# Patient Record
Sex: Female | Born: 1950 | Race: White | Hispanic: No | Smoking: Former smoker
Health system: Southern US, Community
[De-identification: ages and names within clinical notes are randomized; demographics above are authoritative.]

## PROBLEM LIST (undated history)

## (undated) DIAGNOSIS — G43909 Migraine, unspecified, not intractable, without status migrainosus: Secondary | ICD-10-CM

## (undated) DIAGNOSIS — L230 Allergic contact dermatitis due to metals: Secondary | ICD-10-CM

## (undated) DIAGNOSIS — Z780 Asymptomatic menopausal state: Secondary | ICD-10-CM

## (undated) DIAGNOSIS — E039 Hypothyroidism, unspecified: Secondary | ICD-10-CM

## (undated) DIAGNOSIS — M8580 Other specified disorders of bone density and structure, unspecified site: Secondary | ICD-10-CM

## (undated) DIAGNOSIS — E7800 Pure hypercholesterolemia, unspecified: Secondary | ICD-10-CM

## (undated) DIAGNOSIS — R87619 Unspecified abnormal cytological findings in specimens from cervix uteri: Secondary | ICD-10-CM

## (undated) DIAGNOSIS — E559 Vitamin D deficiency, unspecified: Secondary | ICD-10-CM

## (undated) DIAGNOSIS — F329 Major depressive disorder, single episode, unspecified: Secondary | ICD-10-CM

## (undated) DIAGNOSIS — R7301 Impaired fasting glucose: Secondary | ICD-10-CM

## (undated) HISTORY — DX: Asymptomatic menopausal state: Z78.0

## (undated) HISTORY — DX: Unspecified abnormal cytological findings in specimens from cervix uteri: R87.619

## (undated) HISTORY — DX: Major depressive disorder, single episode, unspecified: F32.9

## (undated) HISTORY — DX: Pure hypercholesterolemia, unspecified: E78.00

## (undated) HISTORY — PX: TUBAL LIGATION: SHX77

## (undated) HISTORY — PX: OTHER SURGICAL HISTORY: SHX169

## (undated) HISTORY — DX: Migraine, unspecified, not intractable, without status migrainosus: G43.909

## (undated) HISTORY — DX: Vitamin D deficiency, unspecified: E55.9

## (undated) HISTORY — DX: Allergic contact dermatitis due to metals: L23.0

## (undated) HISTORY — DX: Impaired fasting glucose: R73.01

## (undated) HISTORY — DX: Other specified disorders of bone density and structure, unspecified site: M85.80

## (undated) HISTORY — DX: Hypothyroidism, unspecified: E03.9

---

## 1999-03-29 ENCOUNTER — Other Ambulatory Visit: Admission: RE | Admit: 1999-03-29 | Discharge: 1999-03-29 | Payer: Self-pay | Admitting: Family Medicine

## 1999-04-19 DIAGNOSIS — M8580 Other specified disorders of bone density and structure, unspecified site: Secondary | ICD-10-CM

## 1999-04-19 HISTORY — DX: Other specified disorders of bone density and structure, unspecified site: M85.80

## 2000-04-18 DIAGNOSIS — E039 Hypothyroidism, unspecified: Secondary | ICD-10-CM

## 2000-04-18 HISTORY — DX: Hypothyroidism, unspecified: E03.9

## 2000-12-05 ENCOUNTER — Other Ambulatory Visit: Admission: RE | Admit: 2000-12-05 | Discharge: 2000-12-05 | Payer: Self-pay | Admitting: Family Medicine

## 2001-12-06 ENCOUNTER — Other Ambulatory Visit: Admission: RE | Admit: 2001-12-06 | Discharge: 2001-12-06 | Payer: Self-pay | Admitting: Family Medicine

## 2002-04-18 DIAGNOSIS — F32A Depression, unspecified: Secondary | ICD-10-CM

## 2002-04-18 HISTORY — DX: Depression, unspecified: F32.A

## 2002-12-09 ENCOUNTER — Other Ambulatory Visit: Admission: RE | Admit: 2002-12-09 | Discharge: 2002-12-09 | Payer: Self-pay | Admitting: Family Medicine

## 2003-01-06 ENCOUNTER — Ambulatory Visit (HOSPITAL_COMMUNITY): Admission: RE | Admit: 2003-01-06 | Discharge: 2003-01-06 | Payer: Self-pay | Admitting: Gastroenterology

## 2003-03-19 ENCOUNTER — Ambulatory Visit (HOSPITAL_COMMUNITY): Admission: RE | Admit: 2003-03-19 | Discharge: 2003-03-19 | Payer: Self-pay | Admitting: Gastroenterology

## 2003-03-19 LAB — HM COLONOSCOPY

## 2003-12-29 ENCOUNTER — Other Ambulatory Visit: Admission: RE | Admit: 2003-12-29 | Discharge: 2003-12-29 | Payer: Self-pay | Admitting: Family Medicine

## 2004-04-18 DIAGNOSIS — R7301 Impaired fasting glucose: Secondary | ICD-10-CM

## 2004-04-18 HISTORY — DX: Impaired fasting glucose: R73.01

## 2005-01-11 ENCOUNTER — Other Ambulatory Visit: Admission: RE | Admit: 2005-01-11 | Discharge: 2005-01-11 | Payer: Self-pay | Admitting: Family Medicine

## 2005-03-15 ENCOUNTER — Encounter: Admission: RE | Admit: 2005-03-15 | Discharge: 2005-04-17 | Payer: Self-pay | Admitting: Family Medicine

## 2007-01-18 LAB — HM PAP SMEAR

## 2009-05-05 LAB — HM MAMMOGRAPHY

## 2011-04-04 ENCOUNTER — Other Ambulatory Visit (HOSPITAL_COMMUNITY)
Admission: RE | Admit: 2011-04-04 | Discharge: 2011-04-04 | Disposition: A | Discharge status: Home / Self Care | Payer: 59 | Source: Ambulatory Visit | Attending: Family Medicine | Admitting: Family Medicine

## 2011-04-04 ENCOUNTER — Ambulatory Visit (INDEPENDENT_AMBULATORY_CARE_PROVIDER_SITE_OTHER): Payer: Self-pay | Admitting: Family Medicine

## 2011-04-04 ENCOUNTER — Encounter: Payer: Self-pay | Admitting: Family Medicine

## 2011-04-04 VITALS — BP 130/80 | HR 64 | Ht 65.0 in | Wt 136.0 lb

## 2011-04-04 DIAGNOSIS — M8580 Other specified disorders of bone density and structure, unspecified site: Secondary | ICD-10-CM

## 2011-04-04 DIAGNOSIS — G43909 Migraine, unspecified, not intractable, without status migrainosus: Secondary | ICD-10-CM | POA: Insufficient documentation

## 2011-04-04 DIAGNOSIS — R7301 Impaired fasting glucose: Secondary | ICD-10-CM

## 2011-04-04 DIAGNOSIS — E039 Hypothyroidism, unspecified: Secondary | ICD-10-CM

## 2011-04-04 DIAGNOSIS — Z0000 Encounter for general adult medical examination without abnormal findings: Secondary | ICD-10-CM

## 2011-04-04 DIAGNOSIS — Z01419 Encounter for gynecological examination (general) (routine) without abnormal findings: Secondary | ICD-10-CM | POA: Insufficient documentation

## 2011-04-04 DIAGNOSIS — M949 Disorder of cartilage, unspecified: Secondary | ICD-10-CM

## 2011-04-04 DIAGNOSIS — E559 Vitamin D deficiency, unspecified: Secondary | ICD-10-CM

## 2011-04-04 DIAGNOSIS — E7800 Pure hypercholesterolemia, unspecified: Secondary | ICD-10-CM

## 2011-04-04 LAB — TSH: TSH: 0.975 u[IU]/mL (ref 0.350–4.500)

## 2011-04-04 LAB — COMPREHENSIVE METABOLIC PANEL
ALT: 13 U/L (ref 0–35)
CO2: 27 mEq/L (ref 19–32)
Calcium: 9.1 mg/dL (ref 8.4–10.5)
Chloride: 104 mEq/L (ref 96–112)
Creat: 0.62 mg/dL (ref 0.50–1.10)
Glucose, Bld: 82 mg/dL (ref 70–99)
Total Bilirubin: 0.3 mg/dL (ref 0.3–1.2)
Total Protein: 6.6 g/dL (ref 6.0–8.3)

## 2011-04-04 LAB — POCT URINALYSIS DIPSTICK
Blood, UA: NEGATIVE
Glucose, UA: NEGATIVE
Protein, UA: NEGATIVE
Spec Grav, UA: <=1.005
Urobilinogen, UA: NEGATIVE

## 2011-04-04 LAB — LIPID PANEL
HDL: 68 mg/dL (ref >39)
LDL Cholesterol: 109 mg/dL — ABNORMAL HIGH (ref 0–99)
Triglycerides: 69 mg/dL (ref <150)

## 2011-04-04 MED ORDER — RIZATRIPTAN BENZOATE 10 MG PO TBDP: 10.0000 mg | ORAL_TABLET | ORAL | Status: DC | PRN

## 2011-04-04 NOTE — Patient Instructions (Addendum)
HEALTH MAINTENANCE RECOMMENDATIONS:  It is recommended that you get at least 30 minutes of aerobic exercise at least 5 days/week (for weight loss, you may need as much as 60-90 minutes). This can be any activity that gets your heart rate up. This can be divided in 10-15 minute intervals if needed, but try and build up your endurance at least once a week.  Weight bearing exercise is also recommended twice weekly.  Eat a healthy diet with lots of vegetables, fruits and fiber.  "Colorful" foods have a lot of vitamins (ie green vegetables, tomatoes, red peppers, etc).  Limit sweet tea, regular sodas and alcoholic beverages, all of which has a lot of calories and sugar.  Up to 1 alcoholic drink daily may be beneficial for women (unless trying to lose weight, watch sugars).  Drink a lot of water.  Calcium recommendations are 1200-1500 mg daily (1500 mg for postmenopausal women or women without ovaries), and vitamin D 1000 IU daily.  This should be obtained from diet and/or supplements (vitamins), and calcium should not be taken all at once, but in divided doses.  Monthly self breast exams and yearly mammograms for women over the age of 39 is recommended.  Sunscreen of at least SPF 30 should be used on all sun-exposed parts of the skin when outside between the hours of 10 am and 4 pm (not just when at beach or pool, but even with exercise, golf, tennis, and yard work!)  Use a sunscreen that says "broad spectrum" so it covers both UVA and UVB rays, and make sure to reapply every 1-2 hours.  Remember to change the batteries in your smoke detectors when changing your clock times in the spring and fall.  Use your seat belt every time you are in a car, and please drive safely and not be distracted with cell phones and texting while driving.   Please schedule mammogram (yearly). Your bone density is due again in January 2013 Please schedule routine eye exam. Schedule nurse visit for TdaP (tetanus with  pertussis) and Zostavax (shingles vaccine).  Please call your insurance company to verify coverage of vaccines

## 2011-04-04 NOTE — Progress Notes (Signed)
Anna Scott is a 60 y.o. female who presents for a complete physical.  She has the following concerns: None. She is here for physical, to establish care, and needs refills on her thyroid and migraine medications.  Gets migraines once or twice a month, and meds work well.  Health Maintenance: Immunization History  Administered Date(s) Administered  . Pneumococcal Conjugate 01/18/2007  . Td 12/06/2001  Had flu shot in September of 2012 Last Pap smear: 12/09 Last mammogram: 04/2009 Last colonoscopy: 2004 Dr. Loreta Ave Last DEXA: 2007 Garald Braver), 04/2009 per last OV in old records Dentist: every 6 months Ophtho: many years ago (7-8 years) Exercise: walks 30 minutes 5x/week  Past Medical History  Diagnosis Date  . Migraine headache   . Unspecified hypothyroidism 2002  . Abnormal Pap smear of cervix early 30's    s/p laser treatment  . Postmenopausal   . Depression 2004    stress at work  . Impaired fasting glucose 2006  . Pure hypercholesterolemia     statin intolerance  . 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 Osteopenia 2001  . Unspecified vitamin D deficiency     Past Surgical History  Procedure Date  . Tubal ligation   . Laser treatment for abnormal pap 30's    History   Social History  . Marital Status: Widowed    Spouse Name: N/A    Number of Children: 1  . 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 Smoking status: Former Smoker    Quit date: 04/18/2000  . Smokeless tobacco: Never Used  . Alcohol Use: Yes     few times a year  . Drug Use: No  . Sexually Active: Not Currently   Other Topics Concern  . Not on file   Social History Narrative   Lives alone.  Daughter lives in [place]    Family History  Problem Relation Age of Onset  . Cancer Mother     lung cancer  . Diabetes Sister   . Hyperlipidemia Sister   . Hypertension Sister   . Hypothyroidism Sister   . Heart disease  Maternal Grandfather     70's MI   Current outpatient prescriptions:calcium carbonate (OS-CAL) 600 MG TABS, Take 600 mg by mouth daily.  , Disp: , Rfl: ;  fish oil-omega-3 fatty acids 1000 MG capsule, Take 2 g by mouth daily.  , Disp: , Rfl: ;  levothyroxine (LEVOXYL) 125 MCG tablet, Take 125 mcg by mouth daily.  , Disp: , Rfl: ;  Multiple Vitamin (MULTIVITAMIN) tablet, Take 1 tablet by mouth daily.  , Disp: , Rfl:  rizatriptan (MAXALT-MLT) 10 MG disintegrating tablet, Take 1 tablet (10 mg total) by mouth as needed. May repeat in 2 hours if needed, Disp: 30 tablet, Rfl: 0;  vitamin D, CHOLECALCIFEROL, 400 UNITS tablet, Take 800 Units by mouth daily.  , Disp: , Rfl:   Allergies  Allergen Reactions  . Codeine Other (See Comments)    Nausea and felt like she was in another world.   ROS:  The patient denies anorexia, fever, weight changes, vision changes, decreased hearing, ear pain, sore throat, breast concerns, chest pain, palpitations, dizziness, syncope, dyspnea on exertion, cough, swelling, nausea, vomiting, diarrhea, constipation, abdominal pain, indigestion/heartburn, hematuria, incontinence, dysuria, vaginal bleeding, discharge, odor or itch, genital lesions, joint pains, numbness, tingling, weakness, tremor, suspicious skin lesions, depression, anxiety, abnormal bleeding/bruising, or enlarged lymph nodes.  +recent  URI with residual cough.  Occasional peri-anal itching and soreness/bump.  Rare BRB on toilet paper after a hard stool  PHYSICAL EXAM: BP 130/80  Pulse 64  Ht 5\' 5"  (1.651 m)  Wt 136 lb (61.689 kg)  BMI 22.63 kg/m2  General Appearance:    Alert, cooperative, no distress, appears stated age  Head:    Normocephalic, without obvious abnormality, atraumatic  Eyes:    PERRL, conjunctiva/corneas clear, EOM's intact, fundi    benign  Ears:    Normal TM's and external ear canals  Nose:   Nares normal, mucosa normal, no drainage or sinus   tenderness  Throat:   Lips, mucosa, and  tongue normal; teeth and gums normal  Neck:   Supple, no lymphadenopathy;  thyroid:  no   enlargement/tenderness/nodules; no carotid   bruit or JVD  Back:    Spine nontender, no curvature, ROM normal, no CVA     tenderness  Lungs:     Clear to auscultation bilaterally without wheezes, rales or     ronchi; respirations unlabored  Chest Wall:    No tenderness or deformity   Heart:    Regular rate and rhythm, S1 and S2 normal, no murmur, rub   or gallop  Breast Exam:    No tenderness, masses, or nipple discharge or inversion.      No axillary lymphadenopathy  Abdomen:     Soft, non-tender, nondistended, normoactive bowel sounds,    no masses, no hepatosplenomegaly  Genitalia:    Normal external genitalia without lesions.  BUS and vagina normal; cervix without lesions, or cervical motion tenderness. No abnormal vaginal discharge.  Uterus and adnexa not enlarged, nontender, no masses.  Pap performed  Rectal:    Normal tone, no masses or tenderness; guaiac negative stool  Extremities:   No clubbing, cyanosis or edema  Pulses:   2+ and symmetric all extremities  Skin:   Skin color, texture, turgor normal, no rashes or lesions  Lymph nodes:   Cervical, supraclavicular, and axillary nodes normal  Neurologic:   CNII-XII intact, normal strength, sensation and gait; reflexes 2+ and symmetric throughout          Psych:   Normal mood, affect, hygiene and grooming.    ASSESSMENT/PLAN: 1. Routine general medical examination at a health care facility  POCT Urinalysis Dipstick, Visual acuity screening, Cytology - PAP  2. Pure hypercholesterolemia  Lipid panel, Comprehensive metabolic panel  3. Impaired fasting glucose  Comprehensive metabolic panel, Hemoglobin A1c  4. Unspecified vitamin D deficiency  Vitamin D 25 hydroxy  5. Unspecified hypothyroidism  TSH  6. Migraine  rizatriptan (MAXALT-MLT) 10 MG disintegrating tablet   Needs Rx for Levoxyl to [facility]--await lab results first  Discussed monthly self  breast exams and yearly mammograms; at least 30 minutes of aerobic activity at least 5 days/week; proper sunscreen use reviewed; healthy diet, including goals of calcium and vitamin D intake and alcohol recommendations (less than or equal to 1 drink/day) reviewed; regular seatbelt use; changing batteries in smoke detectors.  Immunization recommendations discussed--TdaP and Zostavax were recommended.  She will schedule nurse visit within a month or so.  To check insurance prior to visit.  Colonoscopy recommendations reviewed--UTD  Discussed need to schedule mammogram and have it done yearly, as well as need to schedule routine eye exam Also patient to schedule bone density (due 04/2011) at [facility].

## 2011-04-05 ENCOUNTER — Encounter: Payer: Self-pay | Admitting: Family Medicine

## 2011-04-05 LAB — HEMOGLOBIN A1C: Mean Plasma Glucose: 128 mg/dL — ABNORMAL HIGH (ref <117)

## 2011-04-05 LAB — VITAMIN D 25 HYDROXY (VIT D DEFICIENCY, FRACTURES): Vit D, 25-Hydroxy: 35 ng/mL (ref 30–89)

## 2011-04-05 MED ORDER — LEVOXYL 125 MCG PO TABS: 125.0000 ug | ORAL_TABLET | Freq: Every day | ORAL | Status: DC

## 2011-04-06 ENCOUNTER — Encounter: Payer: Self-pay | Admitting: Family Medicine

## 2011-05-10 LAB — HM DEXA SCAN

## 2011-07-28 ENCOUNTER — Encounter: Payer: Self-pay | Admitting: Family Medicine

## 2011-07-28 ENCOUNTER — Ambulatory Visit (INDEPENDENT_AMBULATORY_CARE_PROVIDER_SITE_OTHER): Payer: 59 | Admitting: Family Medicine

## 2011-07-28 DIAGNOSIS — H9209 Otalgia, unspecified ear: Secondary | ICD-10-CM

## 2011-07-28 NOTE — Patient Instructions (Signed)
Ear discomfort/weird sensation--no evidence of cerumen impaction (wax buildup).  With the lack of wax present, may have some symptoms related to dryness or lack of lubrication.  Continue with oil drops.  Consider trying claritin or zyrtec which may help with some of the itching sensation, as there seems to be a mild component of allergies.  Return if symptoms persist or worsen.  Do not use Q-tips

## 2011-07-28 NOTE — Progress Notes (Signed)
Chief complaint:  crawling sensation in both ears, right worse than left.   HPI:  Has been having intermittent symptoms in both ears for the last 2-3 weeks of "crawling sensation".  Ears are sometimes itchy, but not much.  Feels like something is crawling in her ears.  Had a cold about 3 weeks ago, and symptoms began just after the cold/around same time. Has been using some mineral oil/olive oil drops.  Hasn't helped much.  Has slight sniffles, nothing worse than normal.  Denies plugging and popping in her ears.  Denies decreased hearing.  Ringing in ears x years, chronic and unchanged  Past Medical History  Diagnosis Date  . Migraine headache   . Unspecified hypothyroidism 2002  . Abnormal Pap smear of cervix early 30's    s/p laser treatment  . Postmenopausal   . Depression 2004    stress at work  . Impaired fasting glucose 2006  . Pure hypercholesterolemia     statin intolerance  . 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 Osteopenia 2001  . Unspecified vitamin D deficiency     Past Surgical History  Procedure Date  . Tubal ligation   . Laser treatment for abnormal pap 30's    History   Social History  . Marital Status: Widowed    Spouse Name: N/A    Number of Children: 1  . 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 Smoking status: Former Smoker    Quit date: 04/18/2000  . Smokeless tobacco: Never Used  . Alcohol Use: Yes     few times a year  . Drug Use: No  . Sexually Active: Not Currently   Other Topics Concern  . Not on file   Social History Narrative   Lives alone.  Daughter lives in [place]    Family History  Problem Relation Age of Onset  . Cancer Mother     lung cancer  . Diabetes Sister   . Hyperlipidemia Sister   . Hypertension Sister   . Hypothyroidism Sister   . Heart disease Maternal Grandfather     70's MI    Current outpatient prescriptions:calcium  carbonate (OS-CAL) 600 MG TABS, Take 600 mg by mouth daily.  , Disp: , Rfl: ;  fish oil-omega-3 fatty acids 1000 MG capsule, Take 2 g by mouth daily.  , Disp: , Rfl: ;  LEVOXYL 125 MCG tablet, Take 1 tablet (125 mcg total) by mouth daily., Disp: 90 tablet, Rfl: 3;  Multiple Vitamin (MULTIVITAMIN) tablet, Take 1 tablet by mouth daily.  , Disp: , Rfl:  rizatriptan (MAXALT-MLT) 10 MG disintegrating tablet, Take 1 tablet (10 mg total) by mouth as needed. May repeat in 2 hours if needed, Disp: 30 tablet, Rfl: 0;  vitamin D, CHOLECALCIFEROL, 400 UNITS tablet, Take 800 Units by mouth daily.  , Disp: , Rfl:   Allergies  Allergen Reactions  . Codeine Other (See Comments)    Nausea and felt like she was in another world.   ROS:  Denies fevers, ear pain, sinus pain, sore throat, cough, shortness of breath, skin rash, thyroid symptoms, headaches or other problems.  PHYSICAL EXAM: BP 132/80  Pulse 68  Temp(Src) 98.3 F (36.8 C) (Oral)  Ht 5\' 5"  (1.651 m)  Wt 135 lb (61.236 kg)  BMI 22.47 kg/m2 Well developed, pleasant female in no distress HEENT:  PERRL, EOMI, conjunctiva clear.  Ears completely normal--no  wax.  Slight bit of probable cotton in R posterior distal portion of canal.  No erythema, exudate. TM's normal. EAC's without any erythema, edema, exudate, very clean/slightly dry. Nose--mild edema with clear mucus L>R Sinuses nontender OP clear Neck: no lymphadenopathy  ASSESSMENT/PLAN: 1. Otalgia    Ear discomfort/weird sensation--no evidence of cerumen impaction.  With the lack of cerumen present, may have some symptoms related to dryness or lack of lubrication.  Continue with oil drops.  Consider trying claritin or zyrtec which may help with some of the itching sensation, as there seems to be a mild component of allergies.  Return if symptoms persist or worsen.  Do not use Q-tips

## 2011-08-12 ENCOUNTER — Other Ambulatory Visit: Payer: Self-pay | Admitting: Family Medicine

## 2011-08-12 MED ORDER — LEVOTHYROXINE SODIUM 125 MCG PO TABS: 125.0000 ug | ORAL_TABLET | Freq: Every day | ORAL | Status: DC

## 2011-08-12 NOTE — Telephone Encounter (Signed)
There was a [place] recall on Levoxyl.  Anyone who calls for Levoxyl should be changed to the same dose of Synthroid (brand). Thanks

## 2011-08-12 NOTE — Telephone Encounter (Signed)
PLS SIGN OFF ON MEDICATION, THANKS

## 2011-08-12 NOTE — Telephone Encounter (Signed)
Dr.knapp do you want to switch to levothyroxine or Synthroid which are both in stock since the Levoxyl is out of stock/nationwide shortage-Please advise, pt is almost out of pills-lm

## 2011-12-13 ENCOUNTER — Telehealth: Payer: Self-pay | Admitting: Internal Medicine

## 2011-12-13 NOTE — Telephone Encounter (Signed)
Pt would like generic synthroid sent to [facility].

## 2011-12-14 ENCOUNTER — Other Ambulatory Visit: Payer: Self-pay | Admitting: *Deleted

## 2011-12-14 DIAGNOSIS — E039 Hypothyroidism, unspecified: Secondary | ICD-10-CM

## 2011-12-14 MED ORDER — LEVOTHYROXINE SODIUM 125 MCG PO TABS: 125.0000 ug | ORAL_TABLET | Freq: Every day | ORAL | Status: DC

## 2011-12-14 NOTE — Telephone Encounter (Signed)
Done

## 2012-03-07 ENCOUNTER — Telehealth: Payer: Self-pay | Admitting: Internal Medicine

## 2012-03-07 DIAGNOSIS — E039 Hypothyroidism, unspecified: Secondary | ICD-10-CM

## 2012-03-07 MED ORDER — LEVOTHYROXINE SODIUM 125 MCG PO TABS: 125.0000 ug | ORAL_TABLET | Freq: Every day | ORAL | Status: DC

## 2012-03-07 NOTE — Telephone Encounter (Signed)
Done

## 2012-05-22 ENCOUNTER — Encounter: Payer: Self-pay | Admitting: Internal Medicine

## 2012-06-04 ENCOUNTER — Encounter: Payer: Self-pay | Admitting: Family Medicine

## 2012-06-04 ENCOUNTER — Ambulatory Visit (INDEPENDENT_AMBULATORY_CARE_PROVIDER_SITE_OTHER): Payer: 59 | Admitting: Family Medicine

## 2012-06-04 VITALS — BP 130/80 | HR 68 | Ht 65.0 in | Wt 140.0 lb

## 2012-06-04 DIAGNOSIS — E039 Hypothyroidism, unspecified: Secondary | ICD-10-CM

## 2012-06-04 DIAGNOSIS — E7800 Pure hypercholesterolemia, unspecified: Secondary | ICD-10-CM

## 2012-06-04 DIAGNOSIS — Z23 Encounter for immunization: Secondary | ICD-10-CM

## 2012-06-04 DIAGNOSIS — G43909 Migraine, unspecified, not intractable, without status migrainosus: Secondary | ICD-10-CM

## 2012-06-04 DIAGNOSIS — R7301 Impaired fasting glucose: Secondary | ICD-10-CM

## 2012-06-04 DIAGNOSIS — Z0000 Encounter for general adult medical examination without abnormal findings: Secondary | ICD-10-CM

## 2012-06-04 DIAGNOSIS — D1779 Benign lipomatous neoplasm of other sites: Secondary | ICD-10-CM

## 2012-06-04 DIAGNOSIS — D171 Benign lipomatous neoplasm of skin and subcutaneous tissue of trunk: Secondary | ICD-10-CM

## 2012-06-04 DIAGNOSIS — Z2911 Encounter for prophylactic immunotherapy for respiratory syncytial virus (RSV): Secondary | ICD-10-CM

## 2012-06-04 LAB — POCT URINALYSIS DIPSTICK
Bilirubin, UA: NEGATIVE
Blood, UA: NEGATIVE
Glucose, UA: NEGATIVE
Nitrite, UA: NEGATIVE
pH, UA: 5

## 2012-06-04 MED ORDER — RIZATRIPTAN BENZOATE 10 MG PO TBDP: 10.0000 mg | ORAL_TABLET | ORAL | Status: AC | PRN

## 2012-06-04 NOTE — Progress Notes (Signed)
Chief Complaint  Patient presents with  . Annual Exam    fasting annual exam with pelvic. Has a lump on her back that she would lke you to look at today.   Anna Scott is a 62 y.o. female who presents for a complete physical.  She has the following concerns:  She noticed a slight mound to the center of her upper back about a month ago.  "feels fatty".  Nontender.  Not sure how long it has truly been there, but she first became aware of it about a month ago.  Denies any change in size since first noticed.  Migraines are infrequent, 0-2/month.  Only rarely needs to use medication (not with every headache, just with severe ones).  Follow-up on hypothyroidism:  Compliant with her medications.  Denies any thyroid-related symptoms.  Health Maintenance: Immunization History  Administered Date(s) Administered  . Pneumococcal Conjugate 01/18/2007  . Td 01/01/1992, 12/06/2001   Last Pap smear: 03/2011 Last mammogram: 05/10/11, due now, plans to schedule soon Last colonoscopy: 2004 Dr. Loreta Ave  Last DEXA: 2007 Garald Braver), 04/2009 per last OV in old records 05/10/11 at [place] (low bone mass, T-1.6) Dentist: every 6 months  Ophtho: many years ago (8-9 years)  Exercise: walks 30 minutes 5x/week  Past Medical History  Diagnosis Date  . Migraine headache   . Unspecified hypothyroidism 2002  . Abnormal Pap smear of cervix early 30's    s/p laser treatment  . Postmenopausal   . Depression 2004    stress at work  . Impaired fasting glucose 2006  . Pure hypercholesterolemia     statin intolerance  . 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 Osteopenia 2001  . Unspecified vitamin D deficiency     Past Surgical History  Procedure Laterality Date  . Tubal ligation    . Laser treatment for abnormal pap  30's    History   Social History  . Marital Status: Widowed    Spouse Name: N/A    Number of Children: 1  . 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 Smoking status: Former Smoker    Quit date: 04/18/2000  . Smokeless tobacco: Never Used  . Alcohol Use: Yes     Comment: few times a year  . Drug Use: No  . Sexually Active: Not Currently   Other Topics Concern  . Not on file   Social History Narrative   Lives alone.  Daughter lives in [place]    Family History  Problem Relation Age of Onset  . Cancer Mother     lung cancer  . Diabetes Sister   . Hyperlipidemia Sister   . Hypertension Sister   . Hypothyroidism Sister   . Heart disease Maternal Grandfather     70's MI    Current outpatient prescriptions:calcium carbonate (OS-CAL) 600 MG TABS, Take 600 mg by mouth daily.  , Disp: , Rfl: ;  levothyroxine (SYNTHROID) 125 MCG tablet, Take 1 tablet (125 mcg total) by mouth daily., Disp: 90 tablet, Rfl: 0;  Multiple Vitamin (MULTIVITAMIN) tablet, Take 1 tablet by mouth daily.  , Disp: , Rfl: ;  vitamin D, CHOLECALCIFEROL, 400 UNITS tablet, Take 800 Units by mouth daily.  , Disp: , Rfl:  fish oil-omega-3 fatty acids 1000 MG capsule, Take 2 g by mouth daily.  , Disp: , Rfl: ;  rizatriptan (MAXALT-MLT) 10 MG disintegrating tablet, Take 1 tablet (10  mg total) by mouth as needed for migraine. May repeat once in 2 hours if needed, Disp: 30 tablet, Rfl: 0  Allergies  Allergen Reactions  . Codeine Other (See Comments)    Nausea and felt like she was in another world.   ROS: The patient denies anorexia, fever, weight changes, vision changes, decreased hearing, ear pain, sore throat, breast concerns, chest pain, palpitations, dizziness, syncope, dyspnea on exertion, cough, swelling, nausea, vomiting, diarrhea, constipation, abdominal pain, indigestion/heartburn, hematuria, incontinence, dysuria, vaginal bleeding, discharge, odor or itch, genital lesions, joint pains, numbness, tingling, weakness, tremor, suspicious skin lesions, depression, anxiety, abnormal bleeding/bruising, or enlarged lymph nodes.  Some  dry skin and itchy rashes periodically; known h/o eczema. Mild urinary frequency   PHYSICAL EXAM: BP 130/80  Pulse 68  Ht 5\' 5"  (1.651 m)  Wt 140 lb (63.504 kg)  BMI 23.3 kg/m2  General Appearance:  Alert, cooperative, no distress, appears stated age   Head:  Normocephalic, without obvious abnormality, atraumatic   Eyes:  PERRL, conjunctiva/corneas clear, EOM's intact, fundi  benign   Ears:  Normal TM's and external ear canals   Nose:  Nares normal, mucosa normal, no drainage or sinus tenderness   Throat:  Lips, mucosa, and tongue normal; teeth and gums normal   Neck:  Supple, no lymphadenopathy; thyroid: no enlargement/tenderness/nodules; no carotid  bruit or JVD   Back:  Spine nontender, no curvature, ROM normal, no CVA tenderness. Soft, fatty 5 x 4 cm nontender subcutaneous mass just to the right of midline in upper back.  Nontender,  No overlying redness, induration, or other abnormality.  Lungs:  Clear to auscultation bilaterally without wheezes, rales or ronchi; respirations unlabored   Chest Wall:  No tenderness or deformity   Heart:  Regular rate and rhythm, S1 and S2 normal, no murmur, rub  or gallop   Breast Exam:  No tenderness, masses, or nipple discharge or inversion. No axillary lymphadenopathy   Abdomen:  Soft, non-tender, nondistended, normoactive bowel sounds,  no masses, no hepatosplenomegaly   Genitalia:  Normal external genitalia without lesions. BUS and vagina normal; no cervical motion tenderness. No abnormal vaginal discharge. Uterus and adnexa not enlarged, nontender, no masses. Pap not performed   Rectal:  Normal tone, no masses or tenderness; guaiac negative stool   Extremities:  No clubbing, cyanosis or edema   Pulses:  2+ and symmetric all extremities   Skin:  Skin color, texture, turgor normal, no rashes or lesions   Lymph nodes:  Cervical, supraclavicular, and axillary nodes normal   Neurologic:  CNII-XII intact, normal strength, sensation and gait;  reflexes 2+ and symmetric throughout           Psych: Normal mood, affect, hygiene and grooming.    ASSESSMENT/PLAN:  Routine general medical examination at a health care facility - Plan: POCT Urinalysis Dipstick, Visual acuity screening  Unspecified hypothyroidism - Plan: TSH  Need for Tdap vaccination - Plan: Tdap vaccine greater than or equal to 7yo IM  Need for shingles vaccine - Plan: Varicella-zoster vaccine subcutaneous  Impaired fasting glucose - Plan: Comprehensive metabolic panel, Hemoglobin A1c  Migraine - Plan: rizatriptan (MAXALT-MLT) 10 MG disintegrating tablet  Pure hypercholesterolemia - Plan: Lipid panel, Comprehensive metabolic panel  Lipoma of back  Probably lipoma of upper back.  Advised to return if increasing rapidly in size, becomes painful, or other concerns.  If she desires removal, she will let us know and we can refer to [facility]. Differential diagnosis reviewed.  Discussed monthly self breast  exams and yearly mammograms; at least 30 minutes of aerobic activity at least 5 days/week; proper sunscreen use reviewed; healthy diet, including goals of calcium and vitamin D intake and alcohol recommendations (less than or equal to 1 drink/day) reviewed; regular seatbelt use; changing batteries in smoke detectors. Immunization recommendations discussed--TdaP and zostavax today.  Risks and side effects of both immunizations, as well as the benefits, were reviewed.  She has not checked with her insurance regarding zostavax coverage, but is willing to pay for it (approx $300 quoted to her) if insurance doesn't cover. Colonoscopy recommendations reviewed--due this year Pap due 2015.  Schedule routine eye exam Weight bearing exercises 1-2x/week for upper extremities  Hypothyroidism--clinically euthyroid.  TSH today. Refill x 1 year if normal. Migraines--infrequent.  Refill Maxalt for prn use.  REFILL SYNTHROID TO [facility] AFTER LABS BACK

## 2012-06-04 NOTE — Patient Instructions (Signed)
HEALTH MAINTENANCE RECOMMENDATIONS:  It is recommended that you get at least 30 minutes of aerobic exercise at least 5 days/week (for weight loss, you may need as much as 60-90 minutes). This can be any activity that gets your heart rate up. This can be divided in 10-15 minute intervals if needed, but try and build up your endurance at least once a week.  Weight bearing exercise is also recommended twice weekly.  Eat a healthy diet with lots of vegetables, fruits and fiber.  "Colorful" foods have a lot of vitamins (ie green vegetables, tomatoes, red peppers, etc).  Limit sweet tea, regular sodas and alcoholic beverages, all of which has a lot of calories and sugar.  Up to 1 alcoholic drink daily may be beneficial for women (unless trying to lose weight, watch sugars).  Drink a lot of water.  Calcium recommendations are 1200-1500 mg daily (1500 mg for postmenopausal women or women without ovaries), and vitamin D 1000 IU daily.  This should be obtained from diet and/or supplements (vitamins), and calcium should not be taken all at once, but in divided doses.  Monthly self breast exams and yearly mammograms for women over the age of 81 is recommended.  Sunscreen of at least SPF 30 should be used on all sun-exposed parts of the skin when outside between the hours of 10 am and 4 pm (not just when at beach or pool, but even with exercise, golf, tennis, and yard work!)  Use a sunscreen that says "broad spectrum" so it covers both UVA and UVB rays, and make sure to reapply every 1-2 hours.  Remember to change the batteries in your smoke detectors when changing your clock times in the spring and fall.  Use your seat belt every time you are in a car, and please drive safely and not be distracted with cell phones and texting while driving.  Schedule routine eye exam Weight bearing exercises 1-2x/week for upper extremities Call Dr. Loreta Ave to schedule colonoscopy

## 2012-06-05 ENCOUNTER — Encounter: Payer: Self-pay | Admitting: Family Medicine

## 2012-06-05 LAB — COMPREHENSIVE METABOLIC PANEL
Albumin: 4.5 g/dL (ref 3.5–5.2)
Alkaline Phosphatase: 57 U/L (ref 39–117)
BUN: 11 mg/dL (ref 6–23)
Calcium: 9.5 mg/dL (ref 8.4–10.5)
Chloride: 104 mEq/L (ref 96–112)
Creat: 0.56 mg/dL (ref 0.50–1.10)
Glucose, Bld: 91 mg/dL (ref 70–99)
Potassium: 4 mEq/L (ref 3.5–5.3)

## 2012-06-05 LAB — LIPID PANEL
Cholesterol: 225 mg/dL — ABNORMAL HIGH (ref 0–200)
Total CHOL/HDL Ratio: 2.9 Ratio
Triglycerides: 86 mg/dL (ref <150)
VLDL: 17 mg/dL (ref 0–40)

## 2012-06-05 LAB — HEMOGLOBIN A1C
Hgb A1c MFr Bld: 6 % — ABNORMAL HIGH (ref <5.7)
Mean Plasma Glucose: 126 mg/dL — ABNORMAL HIGH (ref <117)

## 2012-06-05 MED ORDER — SYNTHROID 125 MCG PO TABS: 125.0000 ug | ORAL_TABLET | Freq: Every day | ORAL | Status: DC

## 2012-06-07 ENCOUNTER — Encounter: Payer: 59 | Admitting: Family Medicine

## 2012-10-05 LAB — HM MAMMOGRAPHY: HM Mammogram: NEGATIVE

## 2012-10-08 ENCOUNTER — Encounter: Payer: Self-pay | Admitting: Internal Medicine

## 2013-01-23 ENCOUNTER — Telehealth: Payer: Self-pay | Admitting: Family Medicine

## 2013-01-23 NOTE — Telephone Encounter (Signed)
Pt called for My Chart Activation code

## 2013-02-21 ENCOUNTER — Other Ambulatory Visit: Payer: Self-pay

## 2013-03-07 ENCOUNTER — Encounter: Payer: Self-pay | Admitting: Internal Medicine

## 2013-03-15 ENCOUNTER — Encounter: Payer: Self-pay | Admitting: Family Medicine

## 2013-03-18 ENCOUNTER — Encounter: Payer: Self-pay | Admitting: *Deleted

## 2013-05-21 ENCOUNTER — Encounter: Payer: Self-pay | Admitting: Family Medicine

## 2013-06-10 ENCOUNTER — Other Ambulatory Visit: Payer: Self-pay | Admitting: *Deleted

## 2013-06-10 MED ORDER — LEVOXYL 125 MCG PO TABS: 125.0000 ug | ORAL_TABLET | Freq: Every day | ORAL | Status: DC

## 2013-06-12 ENCOUNTER — Telehealth: Payer: Self-pay | Admitting: Family Medicine

## 2013-06-12 NOTE — Telephone Encounter (Signed)
dt ?

## 2013-06-13 ENCOUNTER — Encounter: Payer: Self-pay | Admitting: Family Medicine

## 2013-07-04 ENCOUNTER — Encounter: Payer: Self-pay | Admitting: Family Medicine

## 2013-07-04 ENCOUNTER — Other Ambulatory Visit (HOSPITAL_COMMUNITY)
Admission: RE | Admit: 2013-07-04 | Discharge: 2013-07-04 | Disposition: A | Discharge status: Home / Self Care | Payer: 59 | Source: Ambulatory Visit | Attending: Family Medicine | Admitting: Family Medicine

## 2013-07-04 ENCOUNTER — Ambulatory Visit (INDEPENDENT_AMBULATORY_CARE_PROVIDER_SITE_OTHER): Payer: 59 | Admitting: Family Medicine

## 2013-07-04 VITALS — BP 120/74 | HR 64 | Ht 64.5 in | Wt 138.0 lb

## 2013-07-04 DIAGNOSIS — G43909 Migraine, unspecified, not intractable, without status migrainosus: Secondary | ICD-10-CM

## 2013-07-04 DIAGNOSIS — R7301 Impaired fasting glucose: Secondary | ICD-10-CM

## 2013-07-04 DIAGNOSIS — E559 Vitamin D deficiency, unspecified: Secondary | ICD-10-CM

## 2013-07-04 DIAGNOSIS — M949 Disorder of cartilage, unspecified: Secondary | ICD-10-CM

## 2013-07-04 DIAGNOSIS — E7800 Pure hypercholesterolemia, unspecified: Secondary | ICD-10-CM

## 2013-07-04 DIAGNOSIS — D1779 Benign lipomatous neoplasm of other sites: Secondary | ICD-10-CM

## 2013-07-04 DIAGNOSIS — E039 Hypothyroidism, unspecified: Secondary | ICD-10-CM

## 2013-07-04 DIAGNOSIS — D171 Benign lipomatous neoplasm of skin and subcutaneous tissue of trunk: Secondary | ICD-10-CM

## 2013-07-04 DIAGNOSIS — Z0000 Encounter for general adult medical examination without abnormal findings: Secondary | ICD-10-CM

## 2013-07-04 DIAGNOSIS — Z01419 Encounter for gynecological examination (general) (routine) without abnormal findings: Secondary | ICD-10-CM | POA: Insufficient documentation

## 2013-07-04 DIAGNOSIS — M8580 Other specified disorders of bone density and structure, unspecified site: Secondary | ICD-10-CM

## 2013-07-04 DIAGNOSIS — M899 Disorder of bone, unspecified: Secondary | ICD-10-CM

## 2013-07-04 DIAGNOSIS — Z1151 Encounter for screening for human papillomavirus (HPV): Secondary | ICD-10-CM | POA: Insufficient documentation

## 2013-07-04 LAB — POCT URINALYSIS DIPSTICK
Bilirubin, UA: NEGATIVE
Blood, UA: NEGATIVE
Glucose, UA: NEGATIVE
Ketones, UA: NEGATIVE
LEUKOCYTES UA: NEGATIVE
NITRITE UA: NEGATIVE
PH UA: 7
Protein, UA: NEGATIVE
Spec Grav, UA: <=1.005
UROBILINOGEN UA: NEGATIVE

## 2013-07-04 LAB — HEMOGLOBIN A1C
HEMOGLOBIN A1C: 6.1 % — AB (ref <5.7)
MEAN PLASMA GLUCOSE: 128 mg/dL — AB (ref <117)

## 2013-07-04 NOTE — Progress Notes (Signed)
Chief Complaint  Patient presents with  . Annual Exam    fasting annual exam with pelvic. Would like you to take a look on the place on her back that you looked at last year.    Anna Scott is a 63 y.o. female who presents for a complete physical.  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  She can't tell if it has grown at all, denies any pain.  She thinks it is about the same.  Migraines are much improved--hasn't really had one since she retired in 10/2012.  Still has leftover Maxalt from last year.  Follow-up on hypothyroidism: Compliant with her medications. Denies any thyroid-related symptoms.  Denies changes in weight, hair, skin, bowels, moods.  Immunization History  Administered Date(s) Administered  . Influenza Split 01/24/2013  . Pneumococcal Conjugate-13 01/18/2007  . Td 01/01/1992, 12/06/2001  . Tdap 06/04/2012  . Zoster 06/04/2012   Last Pap smear: 03/2011  Last mammogram: 09/2012 at [place] colonoscopy: 02/2013, due again 02/2018 Last DEXA: 05/10/11 at [facility] (low bone mass, T-1.6)  Dentist: every 6 months  Ophtho: many years ago (9-10 years, despite being counseled to schedule yearly) Exercise: walks 30 minutes 5x/week  Past Medical History  Diagnosis Date  . Migraine headache   . Unspecified hypothyroidism 2002  . Abnormal Pap smear of cervix early 30's    s/p laser treatment  . Postmenopausal   . Depression 2004    stress at work  . Impaired fasting glucose 2006  . Pure hypercholesterolemia     statin intolerance  . 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 Osteopenia 2001  . Unspecified vitamin D deficiency    Past Surgical History  Procedure Laterality Date  . Tubal ligation    . Laser treatment for abnormal pap  30's   History   Social History  . Marital Status: Widowed    Spouse Name: N/A    Number of Children: 1  . Years of Education: N/A   Occupational History  . 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 Smoking status: Former Smoker    Quit date: 04/18/2000  . Smokeless tobacco: Never Used  . Alcohol Use: Yes     Comment: maybe once a year  . Drug Use: No  . Sexual Activity: Not Currently   Other Topics Concern  . Not on file   Social History Narrative   Lives alone.  Daughter lives in [place].  Retired 10/2012   Family History  Problem Relation Age of Onset  . Cancer Mother     lung cancer  . Diabetes Sister   . Hyperlipidemia Sister   . Hypertension Sister   . Hypothyroidism Sister   . Heart disease Maternal Grandfather     32's MI   Outpatient Encounter Prescriptions as of 07/04/2013  Medication Sig Note  . Calcium Carbonate-Vit D-Min (CALCIUM 600+D PLUS MINERALS) 600-400 MG-UNIT TABS Take 1 tablet by mouth daily.   . fish oil-omega-3 fatty acids 1000 MG capsule Take 1 g by mouth daily.    Marland Kitchen LEVOXYL 125 MCG tablet Take 1 tablet (125 mcg total) by mouth daily before breakfast.   . Multiple Vitamin (MULTIVITAMIN) tablet Take 1 tablet by mouth daily.     . [DISCONTINUED] calcium carbonate (OS-CAL) 600 MG TABS Take 600 mg by mouth daily.     . rizatriptan (MAXALT-MLT) 10 MG disintegrating tablet Take 1 tablet (10 mg total) by mouth  as needed for migraine. May repeat once in 2 hours if needed 07/04/2013: Not needing since retired--fewer migraines  . [DISCONTINUED] SYNTHROID 125 MCG tablet Take 1 tablet (125 mcg total) by mouth daily.   . [DISCONTINUED] vitamin D, CHOLECALCIFEROL, 400 UNITS tablet Take 800 Units by mouth daily.      Allergies  Allergen Reactions  . Codeine Other (See Comments)    Nausea and felt like she was in another world.    ROS: The patient denies anorexia, fever, weight changes, vision changes, decreased hearing, ear pain, sore throat, breast concerns, chest pain, palpitations, dizziness, syncope, dyspnea on exertion, cough, swelling, nausea, vomiting, diarrhea, constipation, abdominal pain, indigestion/heartburn,  hematuria, incontinence, dysuria, vaginal bleeding, discharge, odor or itch, genital lesions, joint pains, numbness, tingling, weakness, tremor, suspicious skin lesions, depression, anxiety, abnormal bleeding/bruising, or enlarged lymph nodes.  Some dry skin and itchy rashes periodically; known h/o eczema.  Mild urinary frequency, chronic and unchanged. Occasional/intermittent low back pain  PHYSICAL EXAM:  BP 120/74  Pulse 64  Ht 5' 4.5" (1.638 m)  Wt 138 lb (62.596 kg)  BMI 23.33 kg/m2   General Appearance:  Alert, cooperative, no distress, appears stated age   Head:  Normocephalic, without obvious abnormality, atraumatic   Eyes:  PERRL, conjunctiva/corneas clear, EOM's intact, fundi  benign   Ears:  Normal TM's and external ear canals   Nose:  Nares normal, mucosa normal, no drainage or sinus tenderness   Throat:  Lips, mucosa, and tongue normal; teeth and gums normal   Neck:  Supple, no lymphadenopathy; thyroid: no enlargement/tenderness/nodules; no carotid  bruit or JVD   Back:  Spine nontender, no curvature, ROM normal, no CVA tenderness. Soft, fatty 5 x 5-5.5 cm nontender subcutaneous mass just to the right of midline in upper back. Nontender, No overlying redness, induration, or other abnormality. Borders are not completely distinct, so hard to know exact measurement--there may have been just very slight increase (or known) in size over the last year  Lungs:  Clear to auscultation bilaterally without wheezes, rales or ronchi; respirations unlabored   Chest Wall:  No tenderness or deformity   Heart:  Regular rate and rhythm, S1 and S2 normal, no murmur, rub  or gallop   Breast Exam:  No tenderness, masses, or nipple discharge or inversion. No axillary lymphadenopathy   Abdomen:  Soft, non-tender, nondistended, normoactive bowel sounds,  no masses, no hepatosplenomegaly   Genitalia:  Normal external genitalia without lesions. BUS and vagina normal; no cervical motion tenderness.  Cervix appears normal without lesions or discharge. No abnormal vaginal discharge. Uterus and adnexa not enlarged, nontender, no masses. Pap performed   Rectal:  Normal tone, no masses or tenderness; guaiac negative stool   Extremities:  No clubbing, cyanosis or edema   Pulses:  2+ and symmetric all extremities   Skin:  Skin color, texture, turgor normal, no rashes or lesions   Lymph nodes:  Cervical, supraclavicular, and axillary nodes normal   Neurologic:  CNII-XII intact, normal strength, sensation and gait; reflexes 2+ and symmetric throughout          Psych: Normal mood, affect, hygiene and grooming.   Urine dip: normal  ASSESSMENT/PLAN:  Routine general medical examination at a health care facility - Plan: POCT Urinalysis Dipstick, Visual acuity screening, Lipid panel, TSH, Glucose, random, Cytology - PAP [place]  Unspecified hypothyroidism - euthyroid by history.  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  no significant change (perhaps very slight increase in size, if any)  Migraine - significantly improved since retiring.  prn triptan  Osteopenia - Plan: Vit D  25 hydroxy (rtn osteoporosis monitoring)  Unspecified vitamin D deficiency - Plan: Vit D  25 hydroxy (rtn osteoporosis monitoring)  Pure hypercholesterolemia - Plan: Lipid panel  Discussed monthly self breast exams and yearly mammograms; at least 30 minutes of aerobic activity at least 5 days/week; proper sunscreen use reviewed; healthy diet, including goals of calcium and vitamin D intake and alcohol recommendations (less than or equal to 1 drink/day) reviewed; regular seatbelt use; changing batteries in smoke detectors. Immunization recommendations discussed--UTD, continue yearly flu shots. Prevnar 13 next year, when 21. Colonoscopy recommendations reviewed--UTD, due again 02/2018.  Pap due  03/2014--discussed doing today (a little early, but doing along with HPV so not needed again for 5 years) vs waiting until next year.  Done today; if normal, will be due again 06/2018.   Schedule routine eye exam (counseled for reasons for doing so; recommended yearly, but she has yet to schedule)  Weight bearing exercises 1-2x/week for upper extremities   Hypothyroidism--clinically euthyroid. TSH today. Refill x 1 year if normal (won't need refill for another 2 months)   Migraines--infrequent. Will call when refill of Maxalt is needed  Osteopenia--reviewed last DEXA in detail.  T-1.6 at fem neck (L).  Significant decline in spine.  She was due for 2 yr f/u this January, but she prefers to wait.  Recommended she get 04/2014, and continue calcium, vitamin D, weight bearing exercise.  Counseled in detail reasons for continued monitoring (she wasn't wanting to continue initially)  F/u 1 year.  She will be on Medicare then--will need to check with her which type, so we know if doing Welcome to Medicare or not

## 2013-07-04 NOTE — Patient Instructions (Signed)
  HEALTH MAINTENANCE RECOMMENDATIONS:  It is recommended that you get at least 30 minutes of aerobic exercise at least 5 days/week (for weight loss, you may need as much as 60-90 minutes). This can be any activity that gets your heart rate up. This can be divided in 10-15 minute intervals if needed, but try and build up your endurance at least once a week.  Weight bearing exercise is also recommended twice weekly.  Eat a healthy diet with lots of vegetables, fruits and fiber.  "Colorful" foods have a lot of vitamins (ie green vegetables, tomatoes, red peppers, etc).  Limit sweet tea, regular sodas and alcoholic beverages, all of which has a lot of calories and sugar.  Up to 1 alcoholic drink daily may be beneficial for women (unless trying to lose weight, watch sugars).  Drink a lot of water.  Calcium recommendations are 1200-1500 mg daily (1500 mg for postmenopausal women or women without ovaries), and vitamin D 1000 IU daily.  This should be obtained from diet and/or supplements (vitamins), and calcium should not be taken all at once, but in divided doses.  Monthly self breast exams and yearly mammograms for women over the age of 24 is recommended.  Sunscreen of at least SPF 30 should be used on all sun-exposed parts of the skin when outside between the hours of 10 am and 4 pm (not just when at beach or pool, but even with exercise, golf, tennis, and yard work!)  Use a sunscreen that says "broad spectrum" so it covers both UVA and UVB rays, and make sure to reapply every 1-2 hours.  Remember to change the batteries in your smoke detectors when changing your clock times in the spring and fall.  Use your seat belt every time you are in a car, and please drive safely and not be distracted with cell phones and texting while driving.  Schedule routine eye exam

## 2013-07-05 LAB — LIPID PANEL
Cholesterol: 212 mg/dL — ABNORMAL HIGH (ref 0–200)
HDL: 84 mg/dL (ref >39)
LDL Cholesterol: 115 mg/dL — ABNORMAL HIGH (ref 0–99)
Total CHOL/HDL Ratio: 2.5 Ratio
Triglycerides: 66 mg/dL (ref <150)
VLDL: 13 mg/dL (ref 0–40)

## 2013-07-05 LAB — TSH: TSH: 0.521 u[IU]/mL (ref 0.350–4.500)

## 2013-07-05 LAB — GLUCOSE, RANDOM: Glucose, Bld: 94 mg/dL (ref 70–99)

## 2013-07-05 LAB — VITAMIN D 25 HYDROXY (VIT D DEFICIENCY, FRACTURES): VIT D 25 HYDROXY: 54 ng/mL (ref 30–89)

## 2013-07-08 ENCOUNTER — Encounter: Payer: Self-pay | Admitting: Internal Medicine

## 2013-09-08 ENCOUNTER — Other Ambulatory Visit: Payer: Self-pay | Admitting: Family Medicine

## 2013-12-05 LAB — HM MAMMOGRAPHY

## 2013-12-16 ENCOUNTER — Other Ambulatory Visit: Payer: Self-pay | Admitting: Family Medicine

## 2013-12-25 ENCOUNTER — Encounter: Payer: Self-pay | Admitting: Internal Medicine

## 2013-12-25 ENCOUNTER — Encounter: Payer: Self-pay | Admitting: Family Medicine

## 2014-02-17 ENCOUNTER — Encounter: Payer: Self-pay | Admitting: Family Medicine

## 2014-02-21 ENCOUNTER — Encounter: Payer: Self-pay | Admitting: Internal Medicine

## 2014-02-25 ENCOUNTER — Other Ambulatory Visit: Payer: Self-pay | Admitting: Family Medicine

## 2014-03-26 ENCOUNTER — Encounter: Payer: Self-pay | Admitting: Medical

## 2014-03-26 ENCOUNTER — Ambulatory Visit (INDEPENDENT_AMBULATORY_CARE_PROVIDER_SITE_OTHER): Payer: 59 | Admitting: Medical

## 2014-03-26 VITALS — BP 122/80 | HR 68 | Temp 97.9°F | Resp 14 | Wt 138.0 lb

## 2014-03-26 DIAGNOSIS — W57XXXA Bitten or stung by nonvenomous insect and other nonvenomous arthropods, initial encounter: Secondary | ICD-10-CM

## 2014-03-26 DIAGNOSIS — M79601 Pain in right arm: Secondary | ICD-10-CM

## 2014-03-26 DIAGNOSIS — M7711 Lateral epicondylitis, right elbow: Secondary | ICD-10-CM

## 2014-03-26 DIAGNOSIS — T148 Other injury of unspecified body region: Secondary | ICD-10-CM

## 2014-03-26 DIAGNOSIS — M79604 Pain in right leg: Secondary | ICD-10-CM

## 2014-03-26 DIAGNOSIS — R21 Rash and other nonspecific skin eruption: Secondary | ICD-10-CM

## 2014-03-26 NOTE — Progress Notes (Signed)
Subjective She noticed a bite on left inner thigh a week ago, thought an insect bit her.   The lesion is improving, but still there.  Not painful, no burning, lesion seems to be decreasing in size/improving. No drainage.   However, she also has been having joints pains the last 2 weeks.  Having pain in right shoulder, elbow and wrist.  Worse in the morning, stiffness.   No joint swelling.  She is right-handed. She lifts and pulls on things daily but no specific injury following trauma.  Using OTC Aspirin for this .  She denies fever, chills, body aches, night sweats, no nausea vomiting or diarrhea. No other rash. No other symptoms.  No other c/o.  Review of systems as in subjective  Objective: Gen:wd, wn, nad Skin: left thigh medially midshaft with 58mm slightly raised erythematous flat lesion, nonspecific Musculoskeletal: Tender over right lateral epicondyle, same area tender with resisted supination, otherwise nontender to palpation, no deformity of right arm, normal range of motion, no other pain with resisted range of motion Upper extremity pulses and cap refill normal Arms normal strength, sensation and DTRs   Assessment: Encounter Diagnoses  Name Primary?  . Lateral epicondylitis (tennis elbow), right Yes  . Insect bite   . Rash and nonspecific skin eruption   . Arm pain, diffuse, right     Plan: Rash, likely insect bite-advise good hygiene, can use oral Benadryl, topical hydrocortisone over-the-counter and if not improving within a week let me know  lateral epicondylitis-advise for the next week using rest, ice, can use arm sling for a few hours at a time if desired  Arm pain-other than epicondylitis no other obvious explanation for her pain, otherwise exam unremarkable almond reassured that the arm symptoms seem to be totally separate from the insect bite or rash/not related  Return if not much improved within 1 week

## 2014-06-15 ENCOUNTER — Other Ambulatory Visit: Payer: Self-pay | Admitting: Family Medicine

## 2014-06-16 ENCOUNTER — Other Ambulatory Visit: Payer: Self-pay | Admitting: Family Medicine

## 2014-07-07 ENCOUNTER — Encounter: Payer: 59 | Admitting: Family Medicine

## 2014-07-22 DIAGNOSIS — E039 Hypothyroidism, unspecified: Secondary | ICD-10-CM | POA: Diagnosis not present

## 2014-07-22 DIAGNOSIS — Z23 Encounter for immunization: Secondary | ICD-10-CM | POA: Diagnosis not present

## 2014-07-22 DIAGNOSIS — Z13220 Encounter for screening for lipoid disorders: Secondary | ICD-10-CM | POA: Diagnosis not present

## 2014-07-22 DIAGNOSIS — Z0000 Encounter for general adult medical examination without abnormal findings: Secondary | ICD-10-CM | POA: Diagnosis not present

## 2014-08-14 ENCOUNTER — Telehealth: Payer: Self-pay | Admitting: Internal Medicine

## 2014-08-14 NOTE — Telephone Encounter (Signed)
Faxed over medical records to [facility] [employer] @ [phone] on April 7,2016

## 2014-10-01 DIAGNOSIS — E039 Hypothyroidism, unspecified: Secondary | ICD-10-CM | POA: Diagnosis not present

## 2014-10-13 ENCOUNTER — Telehealth: Payer: Self-pay | Admitting: Family Medicine

## 2014-10-13 ENCOUNTER — Other Ambulatory Visit: Payer: Self-pay

## 2014-10-13 NOTE — Telephone Encounter (Signed)
Done

## 2014-10-13 NOTE — Telephone Encounter (Signed)
Noted.  Please also take me off as PCP in the chart (so I don't end up getting reports from specialists, ER, etc)

## 2014-10-13 NOTE — Telephone Encounter (Signed)
FYI.  Pt called stating that she has started seeing another doctor and wanted her MyChart account closed out. I deactivated the account.

## 2014-12-10 DIAGNOSIS — Z1231 Encounter for screening mammogram for malignant neoplasm of breast: Secondary | ICD-10-CM | POA: Diagnosis not present

## 2014-12-10 DIAGNOSIS — Z780 Asymptomatic menopausal state: Secondary | ICD-10-CM | POA: Diagnosis not present

## 2014-12-10 DIAGNOSIS — M810 Age-related osteoporosis without current pathological fracture: Secondary | ICD-10-CM | POA: Diagnosis not present

## 2014-12-15 DIAGNOSIS — H2513 Age-related nuclear cataract, bilateral: Secondary | ICD-10-CM | POA: Diagnosis not present

## 2014-12-15 DIAGNOSIS — H524 Presbyopia: Secondary | ICD-10-CM | POA: Diagnosis not present

## 2014-12-15 DIAGNOSIS — H5203 Hypermetropia, bilateral: Secondary | ICD-10-CM | POA: Diagnosis not present

## 2015-05-05 DIAGNOSIS — H40052 Ocular hypertension, left eye: Secondary | ICD-10-CM | POA: Diagnosis not present

## 2015-08-03 DIAGNOSIS — E039 Hypothyroidism, unspecified: Secondary | ICD-10-CM | POA: Diagnosis not present

## 2015-08-03 DIAGNOSIS — Z23 Encounter for immunization: Secondary | ICD-10-CM | POA: Diagnosis not present

## 2015-08-03 DIAGNOSIS — Z0000 Encounter for general adult medical examination without abnormal findings: Secondary | ICD-10-CM | POA: Diagnosis not present

## 2015-12-14 DIAGNOSIS — Z1231 Encounter for screening mammogram for malignant neoplasm of breast: Secondary | ICD-10-CM | POA: Diagnosis not present

## 2016-02-18 ENCOUNTER — Ambulatory Visit: Payer: Self-pay | Admitting: Podiatry

## 2016-02-29 ENCOUNTER — Ambulatory Visit (INDEPENDENT_AMBULATORY_CARE_PROVIDER_SITE_OTHER): Payer: Medicare Other

## 2016-02-29 ENCOUNTER — Ambulatory Visit (INDEPENDENT_AMBULATORY_CARE_PROVIDER_SITE_OTHER): Payer: Medicare Other | Admitting: Podiatry

## 2016-02-29 ENCOUNTER — Encounter: Payer: Self-pay | Admitting: Podiatry

## 2016-02-29 VITALS — BP 123/72

## 2016-02-29 DIAGNOSIS — L84 Corns and callosities: Secondary | ICD-10-CM

## 2016-02-29 DIAGNOSIS — M779 Enthesopathy, unspecified: Secondary | ICD-10-CM | POA: Diagnosis not present

## 2016-02-29 DIAGNOSIS — M7662 Achilles tendinitis, left leg: Secondary | ICD-10-CM | POA: Diagnosis not present

## 2016-02-29 MED ORDER — MELOXICAM 15 MG PO TABS
15.0000 mg | ORAL_TABLET | Freq: Every day | ORAL | 2 refills | Status: DC
Start: 2016-02-29 — End: 2016-03-01

## 2016-02-29 NOTE — Progress Notes (Signed)
   Subjective:    Patient ID: Anna Scott, female    DOB: 05/17/1950, 65 y.o.   MRN: [id number]  HPI 65 year old female presents the office today for concerns of bilateral Achilles. Left side worse than the right. His been on for several weeks. She is having significant tenderness to the area but she has noticed some swelling in her Achilles tendon that she is concerned about. She denies any recent injury or trauma. She's been icing is using anti-inflammatories which is been helping some but is not providing complete relief. She denies any redness or warmth of the area. She also states her left fifth toenail splint and is painful. This is painful with pressure in shoe gear. No other complaints.   Review of Systems  HENT: Positive for tinnitus.   All other systems reviewed and are negative.      Objective:   Physical Exam General: AAO x3, NAD  Dermatological: There is rotation of the fifth toe and there is deep, annular hyperkeratotic lesion to the distal lateral portion of the fifth toe from where she is walking on the toe. Upon debridement there is no underlying ulceration, drainage or any signs of infection. There is no significant tenderness of the toenail the toenail does not appear to be causing issues more the hyperkeratotic lesion. There is no other open lesions or pre-ulcerative lesions.  Vascular: Dorsalis Pedis artery and Posterior Tibial artery pedal pulses are 2/4 bilateral with immedate capillary fill time. Pedal hair growth present. No varicosities and no lower extremity edema present bilateral. There is no pain with calf compression, swelling, warmth, erythema.   Neruologic: Grossly intact via light touch bilateral. Vibratory intact via tuning fork bilateral. Protective threshold with Semmes Wienstein monofilament intact to all pedal sites bilateral.   Musculoskeletal: On the left distal Achilles tendon just proximal to the insertion is a bulbous area. There is minimal  tenderness this area and there is no overlying erythema or increase in warmth. There is no defect noted within the Achilles tendon and Thompson test is negative. There is very minimal tenderness the right side likely from compensating to the left side. Equinus is present. Muscular strength 5/5 in all groups tested bilateral.  Gait: Unassisted, Nonantalgic.     Assessment & Plan:  65 year old female left Achilles tendinitis, hyperkeratotic lesion fifth digit -Treatment options discussed including all alternatives, risks, and complications -Etiology of symptoms were discussed -X-rays were obtained and reviewed with the patient. No evidence of acute fracture. -Discussed stretching, icing exercises daily. Night splint. Prescribed mobic. Discussed side effects of the medication and directed to stop if any are to occur and call the office. Heel lift for the short-term. Discussed with the symptoms continue physical therapy at next appointment. -Hyperkeratotic lesion at the fifth toe was debrided to without complications or bleeding. I believe is more the hyperkeratotic lesion is post the toenail is causing issues. Offloading pads were dispensed. Discussed possible surgical intervention the future if this toe remains symptomatic to derotate the digit. -Follow-up as scheduled or sooner if needed. Call any questions or concerns meantime.  Celesta Gentile, DPM

## 2016-02-29 NOTE — Patient Instructions (Signed)

## 2016-03-01 ENCOUNTER — Telehealth: Payer: Self-pay | Admitting: *Deleted

## 2016-03-01 MED ORDER — MELOXICAM 15 MG PO TABS: 15.0000 mg | ORAL_TABLET | Freq: Every day | ORAL | 2 refills | Status: AC

## 2016-03-01 MED ORDER — NONFORMULARY OR COMPOUNDED ITEM: 2 refills | Status: AC

## 2016-03-01 NOTE — Telephone Encounter (Signed)
Pt states she would like the rx called to [facility] on [address]. I changed the pharmacy location and reordered. I informed pt the rx had been sent to [facility] on [address]. Pt stated she had called the mail order pharmacy for the cream and they said they had not received the rx from yesterday. I told the pt it was faxed today.

## 2016-03-04 ENCOUNTER — Telehealth: Payer: Self-pay | Admitting: *Deleted

## 2016-03-04 NOTE — Telephone Encounter (Signed)
Pt states the Meloxicam made her anxious and unable to sleep, she would like to know what she can take OTC, and discuss the ingredients in the cream prescribed by Dr. Jacqualyn Posey. I told pt to stop the Meloxicam and I would put in the chart that it made her feel bad, anxious and sleepless. I told pt she was less likely to have a systemic reaction to the cream, but if she did it would likely be a light rash, and to report to our office.  Pt states she still had a lump sensation in her stomach.

## 2016-03-28 ENCOUNTER — Ambulatory Visit (INDEPENDENT_AMBULATORY_CARE_PROVIDER_SITE_OTHER): Payer: Medicare Other | Admitting: Podiatry

## 2016-03-28 ENCOUNTER — Encounter: Payer: Self-pay | Admitting: Podiatry

## 2016-03-28 DIAGNOSIS — M7662 Achilles tendinitis, left leg: Secondary | ICD-10-CM | POA: Diagnosis not present

## 2016-03-28 NOTE — Progress Notes (Signed)
Subjective: 65 year old female presents to the office today for follow-up of bilateral achilles tendonitis with the left worse than the right. She's been stretching, icing daily as well as the night splint. She had difficulty taking the meloxicam so she discontinued this medicine. She's been using the topical compound cream. She has not noticed any significant improvement in her symptoms but there is been no worsening. Denies any systemic complaints such as fevers, chills, nausea, vomiting. No acute changes since last appointment, and no other complaints at this time.   Objective: AAO x3, NAD DP/PT pulses palpable bilaterally, CRT less than 3 seconds There is thickening of the Achilles tendon on the left foot just posterior to the insertion on the mid substance. There is no defect noted and Thompson test is negative. There is mild to palpation on the mid substance the Achilles tendon bilaterally. Equinus is present. There is no pain with lateral compression of the calcaneus or any other areas of tenderness bilaterally. Cavus foot type is present. No other open lesions or pre-ulcerative lesions There is no pain with calf compression, swelling, warmth, erythema.  Assessment: Achilles tendinitis left side worse than right  Plan: -Treatment options discussed including all alternatives, risks, and complications -Etiology of symptoms were discussed -At this time given the continuation symptoms without any significant improvement and discuss other treatment options including immobilization, physical therapy, EPAT, MRI. We will start with PT and this was prescribed today through [facility]. I also discussed shoe gear modifications and orthotics -Continue with stretching, icing home as well for now. Also continue with compound cream. Continue night splint. -Follow-up in 4 weeks or after PT or sooner if any problems arise. In the meantime, encouraged to call the office with any questions, concerns, change in  symptoms.   Celesta Gentile, DPM

## 2016-03-29 ENCOUNTER — Telehealth: Payer: Self-pay | Admitting: *Deleted

## 2016-03-29 DIAGNOSIS — M779 Enthesopathy, unspecified: Secondary | ICD-10-CM

## 2016-03-29 NOTE — Telephone Encounter (Signed)
[facility] states needs information on pt. Raquel Sarna states pt called and she has orders for PT with their facility, and she needs copy of orders and LOV with demographics faxed to [phone]. I reviewed pt's orders and no orders were given for PT. I reviewed LOV 03/28/2016 Dr. Jacqualyn Posey had wanted pt to have PT for left Achilles tendonitis 3 x week for 4 weeks and to focus on stretches, gait and balance, strengthening exercises and home exercises. Faxed orders, referral and demographics to [facility].

## 2016-03-31 DIAGNOSIS — M7660 Achilles tendinitis, unspecified leg: Secondary | ICD-10-CM | POA: Diagnosis not present

## 2016-03-31 DIAGNOSIS — M25673 Stiffness of unspecified ankle, not elsewhere classified: Secondary | ICD-10-CM | POA: Diagnosis not present

## 2016-03-31 DIAGNOSIS — M62579 Muscle wasting and atrophy, not elsewhere classified, unspecified ankle and foot: Secondary | ICD-10-CM | POA: Diagnosis not present

## 2016-03-31 DIAGNOSIS — M62569 Muscle wasting and atrophy, not elsewhere classified, unspecified lower leg: Secondary | ICD-10-CM | POA: Diagnosis not present

## 2016-04-27 DIAGNOSIS — M7660 Achilles tendinitis, unspecified leg: Secondary | ICD-10-CM | POA: Diagnosis not present

## 2016-04-27 DIAGNOSIS — M62579 Muscle wasting and atrophy, not elsewhere classified, unspecified ankle and foot: Secondary | ICD-10-CM | POA: Diagnosis not present

## 2016-04-27 DIAGNOSIS — M62569 Muscle wasting and atrophy, not elsewhere classified, unspecified lower leg: Secondary | ICD-10-CM | POA: Diagnosis not present

## 2016-04-27 DIAGNOSIS — M25673 Stiffness of unspecified ankle, not elsewhere classified: Secondary | ICD-10-CM | POA: Diagnosis not present

## 2016-04-29 DIAGNOSIS — M7660 Achilles tendinitis, unspecified leg: Secondary | ICD-10-CM | POA: Diagnosis not present

## 2016-04-29 DIAGNOSIS — M62579 Muscle wasting and atrophy, not elsewhere classified, unspecified ankle and foot: Secondary | ICD-10-CM | POA: Diagnosis not present

## 2016-04-29 DIAGNOSIS — M25673 Stiffness of unspecified ankle, not elsewhere classified: Secondary | ICD-10-CM | POA: Diagnosis not present

## 2016-04-29 DIAGNOSIS — M62569 Muscle wasting and atrophy, not elsewhere classified, unspecified lower leg: Secondary | ICD-10-CM | POA: Diagnosis not present

## 2016-05-10 DIAGNOSIS — H5203 Hypermetropia, bilateral: Secondary | ICD-10-CM | POA: Diagnosis not present

## 2016-05-10 DIAGNOSIS — H2513 Age-related nuclear cataract, bilateral: Secondary | ICD-10-CM | POA: Diagnosis not present

## 2016-05-10 DIAGNOSIS — H524 Presbyopia: Secondary | ICD-10-CM | POA: Diagnosis not present

## 2016-08-08 DIAGNOSIS — Z0000 Encounter for general adult medical examination without abnormal findings: Secondary | ICD-10-CM | POA: Diagnosis not present

## 2016-08-08 DIAGNOSIS — Z1159 Encounter for screening for other viral diseases: Secondary | ICD-10-CM | POA: Diagnosis not present

## 2016-08-08 DIAGNOSIS — E039 Hypothyroidism, unspecified: Secondary | ICD-10-CM | POA: Diagnosis not present

## 2016-11-09 DIAGNOSIS — L720 Epidermal cyst: Secondary | ICD-10-CM | POA: Diagnosis not present

## 2016-11-23 DIAGNOSIS — L910 Hypertrophic scar: Secondary | ICD-10-CM | POA: Diagnosis not present

## 2017-01-09 DIAGNOSIS — K5900 Constipation, unspecified: Secondary | ICD-10-CM | POA: Diagnosis not present

## 2017-01-30 DIAGNOSIS — Z1231 Encounter for screening mammogram for malignant neoplasm of breast: Secondary | ICD-10-CM | POA: Diagnosis not present

## 2017-02-23 DIAGNOSIS — Z23 Encounter for immunization: Secondary | ICD-10-CM | POA: Diagnosis not present

## 2017-05-12 DIAGNOSIS — H5203 Hypermetropia, bilateral: Secondary | ICD-10-CM | POA: Diagnosis not present

## 2017-05-12 DIAGNOSIS — D23122 Other benign neoplasm of skin of left lower eyelid, including canthus: Secondary | ICD-10-CM | POA: Diagnosis not present

## 2017-05-12 DIAGNOSIS — H2513 Age-related nuclear cataract, bilateral: Secondary | ICD-10-CM | POA: Diagnosis not present

## 2017-05-12 DIAGNOSIS — H524 Presbyopia: Secondary | ICD-10-CM | POA: Diagnosis not present

## 2017-08-15 DIAGNOSIS — R5383 Other fatigue: Secondary | ICD-10-CM | POA: Diagnosis not present

## 2017-08-15 DIAGNOSIS — Z0000 Encounter for general adult medical examination without abnormal findings: Secondary | ICD-10-CM | POA: Diagnosis not present

## 2017-08-15 DIAGNOSIS — Z13220 Encounter for screening for lipoid disorders: Secondary | ICD-10-CM | POA: Diagnosis not present

## 2017-08-15 DIAGNOSIS — E039 Hypothyroidism, unspecified: Secondary | ICD-10-CM | POA: Diagnosis not present

## 2017-08-15 DIAGNOSIS — Z1211 Encounter for screening for malignant neoplasm of colon: Secondary | ICD-10-CM | POA: Diagnosis not present

## 2017-09-19 DIAGNOSIS — Z86010 Personal history of colonic polyps: Secondary | ICD-10-CM | POA: Diagnosis not present

## 2017-09-19 DIAGNOSIS — K5900 Constipation, unspecified: Secondary | ICD-10-CM | POA: Diagnosis not present

## 2017-10-24 DIAGNOSIS — D126 Benign neoplasm of colon, unspecified: Secondary | ICD-10-CM | POA: Diagnosis not present

## 2017-10-24 DIAGNOSIS — K635 Polyp of colon: Secondary | ICD-10-CM | POA: Diagnosis not present

## 2017-10-24 DIAGNOSIS — Z86010 Personal history of colonic polyps: Secondary | ICD-10-CM | POA: Diagnosis not present

## 2017-10-27 DIAGNOSIS — K635 Polyp of colon: Secondary | ICD-10-CM | POA: Diagnosis not present

## 2017-10-27 DIAGNOSIS — D126 Benign neoplasm of colon, unspecified: Secondary | ICD-10-CM | POA: Diagnosis not present

## 2018-01-25 DIAGNOSIS — Z23 Encounter for immunization: Secondary | ICD-10-CM | POA: Diagnosis not present

## 2018-02-07 DIAGNOSIS — H9202 Otalgia, left ear: Secondary | ICD-10-CM | POA: Diagnosis not present

## 2018-02-07 DIAGNOSIS — L309 Dermatitis, unspecified: Secondary | ICD-10-CM | POA: Diagnosis not present

## 2018-02-07 DIAGNOSIS — Z6824 Body mass index (BMI) 24.0-24.9, adult: Secondary | ICD-10-CM | POA: Diagnosis not present

## 2018-02-07 DIAGNOSIS — H9313 Tinnitus, bilateral: Secondary | ICD-10-CM | POA: Diagnosis not present

## 2018-02-12 DIAGNOSIS — Z1231 Encounter for screening mammogram for malignant neoplasm of breast: Secondary | ICD-10-CM | POA: Diagnosis not present

## 2018-03-20 DIAGNOSIS — L720 Epidermal cyst: Secondary | ICD-10-CM | POA: Diagnosis not present

## 2018-03-20 DIAGNOSIS — D171 Benign lipomatous neoplasm of skin and subcutaneous tissue of trunk: Secondary | ICD-10-CM | POA: Diagnosis not present

## 2018-03-28 DIAGNOSIS — S61216A Laceration without foreign body of right little finger without damage to nail, initial encounter: Secondary | ICD-10-CM | POA: Diagnosis not present

## 2018-05-17 DIAGNOSIS — L720 Epidermal cyst: Secondary | ICD-10-CM | POA: Diagnosis not present

## 2018-06-01 DIAGNOSIS — H40013 Open angle with borderline findings, low risk, bilateral: Secondary | ICD-10-CM | POA: Diagnosis not present

## 2018-06-01 DIAGNOSIS — H5203 Hypermetropia, bilateral: Secondary | ICD-10-CM | POA: Diagnosis not present

## 2018-06-01 DIAGNOSIS — H2513 Age-related nuclear cataract, bilateral: Secondary | ICD-10-CM | POA: Diagnosis not present

## 2018-06-01 DIAGNOSIS — H524 Presbyopia: Secondary | ICD-10-CM | POA: Diagnosis not present

## 2018-11-06 DIAGNOSIS — E039 Hypothyroidism, unspecified: Secondary | ICD-10-CM | POA: Diagnosis not present

## 2018-11-06 DIAGNOSIS — E7800 Pure hypercholesterolemia, unspecified: Secondary | ICD-10-CM | POA: Diagnosis not present

## 2018-11-06 DIAGNOSIS — M8588 Other specified disorders of bone density and structure, other site: Secondary | ICD-10-CM | POA: Diagnosis not present

## 2018-11-06 DIAGNOSIS — Z0000 Encounter for general adult medical examination without abnormal findings: Secondary | ICD-10-CM | POA: Diagnosis not present

## 2018-11-06 DIAGNOSIS — Z13220 Encounter for screening for lipoid disorders: Secondary | ICD-10-CM | POA: Diagnosis not present

## 2018-12-05 DIAGNOSIS — Z77098 Contact with and (suspected) exposure to other hazardous, chiefly nonmedicinal, chemicals: Secondary | ICD-10-CM | POA: Diagnosis not present

## 2018-12-13 DIAGNOSIS — Z23 Encounter for immunization: Secondary | ICD-10-CM | POA: Diagnosis not present

## 2019-01-15 DIAGNOSIS — E039 Hypothyroidism, unspecified: Secondary | ICD-10-CM | POA: Diagnosis not present

## 2019-02-18 DIAGNOSIS — R29890 Loss of height: Secondary | ICD-10-CM | POA: Diagnosis not present

## 2019-02-18 DIAGNOSIS — M8589 Other specified disorders of bone density and structure, multiple sites: Secondary | ICD-10-CM | POA: Diagnosis not present

## 2019-02-18 DIAGNOSIS — Z1231 Encounter for screening mammogram for malignant neoplasm of breast: Secondary | ICD-10-CM | POA: Diagnosis not present

## 2019-03-19 DIAGNOSIS — Z20828 Contact with and (suspected) exposure to other viral communicable diseases: Secondary | ICD-10-CM | POA: Diagnosis not present

## 2019-03-25 DIAGNOSIS — Z20828 Contact with and (suspected) exposure to other viral communicable diseases: Secondary | ICD-10-CM | POA: Diagnosis not present

## 2019-03-27 ENCOUNTER — Ambulatory Visit: Payer: Medicare Other | Admitting: Neurology

## 2020-02-05 DIAGNOSIS — E039 Hypothyroidism, unspecified: Secondary | ICD-10-CM | POA: Diagnosis not present

## 2020-02-05 DIAGNOSIS — Z23 Encounter for immunization: Secondary | ICD-10-CM | POA: Diagnosis not present

## 2020-02-05 DIAGNOSIS — Z0000 Encounter for general adult medical examination without abnormal findings: Secondary | ICD-10-CM | POA: Diagnosis not present

## 2020-02-05 DIAGNOSIS — Z136 Encounter for screening for cardiovascular disorders: Secondary | ICD-10-CM | POA: Diagnosis not present

## 2020-06-04 DIAGNOSIS — E039 Hypothyroidism, unspecified: Secondary | ICD-10-CM | POA: Diagnosis not present

## 2021-02-15 DIAGNOSIS — R509 Fever, unspecified: Secondary | ICD-10-CM | POA: Diagnosis not present

## 2021-02-15 DIAGNOSIS — R52 Pain, unspecified: Secondary | ICD-10-CM | POA: Diagnosis not present

## 2021-02-15 DIAGNOSIS — Z03818 Encounter for observation for suspected exposure to other biological agents ruled out: Secondary | ICD-10-CM | POA: Diagnosis not present

## 2021-02-24 DIAGNOSIS — J22 Unspecified acute lower respiratory infection: Secondary | ICD-10-CM | POA: Diagnosis not present

## 2021-02-24 DIAGNOSIS — J208 Acute bronchitis due to other specified organisms: Secondary | ICD-10-CM | POA: Diagnosis not present

## 2021-03-01 ENCOUNTER — Ambulatory Visit
Admission: RE | Admit: 2021-03-01 | Discharge: 2021-03-01 | Disposition: A | Discharge status: Home / Self Care | Payer: Medicare Other | Source: Ambulatory Visit | Attending: Family Medicine | Admitting: Family Medicine

## 2021-03-01 ENCOUNTER — Other Ambulatory Visit: Payer: Self-pay | Admitting: Family Medicine

## 2021-03-01 DIAGNOSIS — R059 Cough, unspecified: Secondary | ICD-10-CM

## 2021-03-17 DIAGNOSIS — E039 Hypothyroidism, unspecified: Secondary | ICD-10-CM | POA: Diagnosis not present

## 2021-03-17 DIAGNOSIS — Z13220 Encounter for screening for lipoid disorders: Secondary | ICD-10-CM | POA: Diagnosis not present

## 2021-03-17 DIAGNOSIS — Z0000 Encounter for general adult medical examination without abnormal findings: Secondary | ICD-10-CM | POA: Diagnosis not present

## 2021-03-17 DIAGNOSIS — M8580 Other specified disorders of bone density and structure, unspecified site: Secondary | ICD-10-CM | POA: Diagnosis not present

## 2021-03-17 DIAGNOSIS — Z136 Encounter for screening for cardiovascular disorders: Secondary | ICD-10-CM | POA: Diagnosis not present

## 2021-04-05 DIAGNOSIS — M85851 Other specified disorders of bone density and structure, right thigh: Secondary | ICD-10-CM | POA: Diagnosis not present

## 2021-04-05 DIAGNOSIS — Z1231 Encounter for screening mammogram for malignant neoplasm of breast: Secondary | ICD-10-CM | POA: Diagnosis not present

## 2021-04-05 DIAGNOSIS — M85852 Other specified disorders of bone density and structure, left thigh: Secondary | ICD-10-CM | POA: Diagnosis not present

## 2022-01-03 DIAGNOSIS — D23122 Other benign neoplasm of skin of left lower eyelid, including canthus: Secondary | ICD-10-CM | POA: Diagnosis not present

## 2022-01-03 DIAGNOSIS — H5203 Hypermetropia, bilateral: Secondary | ICD-10-CM | POA: Diagnosis not present

## 2022-01-03 DIAGNOSIS — H2513 Age-related nuclear cataract, bilateral: Secondary | ICD-10-CM | POA: Diagnosis not present

## 2022-01-03 DIAGNOSIS — H0100B Unspecified blepharitis left eye, upper and lower eyelids: Secondary | ICD-10-CM | POA: Diagnosis not present

## 2022-01-03 DIAGNOSIS — H0100A Unspecified blepharitis right eye, upper and lower eyelids: Secondary | ICD-10-CM | POA: Diagnosis not present

## 2022-01-03 DIAGNOSIS — H40013 Open angle with borderline findings, low risk, bilateral: Secondary | ICD-10-CM | POA: Diagnosis not present

## 2022-01-03 DIAGNOSIS — H524 Presbyopia: Secondary | ICD-10-CM | POA: Diagnosis not present

## 2022-04-07 DIAGNOSIS — Z1231 Encounter for screening mammogram for malignant neoplasm of breast: Secondary | ICD-10-CM | POA: Diagnosis not present

## 2022-04-28 DIAGNOSIS — Z6826 Body mass index (BMI) 26.0-26.9, adult: Secondary | ICD-10-CM | POA: Diagnosis not present

## 2022-04-28 DIAGNOSIS — G479 Sleep disorder, unspecified: Secondary | ICD-10-CM | POA: Diagnosis not present

## 2022-04-28 DIAGNOSIS — E039 Hypothyroidism, unspecified: Secondary | ICD-10-CM | POA: Diagnosis not present

## 2022-04-28 DIAGNOSIS — F4329 Adjustment disorder with other symptoms: Secondary | ICD-10-CM | POA: Diagnosis not present

## 2022-04-28 DIAGNOSIS — Z7189 Other specified counseling: Secondary | ICD-10-CM | POA: Diagnosis not present

## 2022-06-13 DIAGNOSIS — Z131 Encounter for screening for diabetes mellitus: Secondary | ICD-10-CM | POA: Diagnosis not present

## 2022-06-13 DIAGNOSIS — F430 Acute stress reaction: Secondary | ICD-10-CM | POA: Diagnosis not present

## 2022-06-13 DIAGNOSIS — E782 Mixed hyperlipidemia: Secondary | ICD-10-CM | POA: Diagnosis not present

## 2022-06-13 DIAGNOSIS — G479 Sleep disorder, unspecified: Secondary | ICD-10-CM | POA: Diagnosis not present

## 2022-06-13 DIAGNOSIS — Z6826 Body mass index (BMI) 26.0-26.9, adult: Secondary | ICD-10-CM | POA: Diagnosis not present

## 2022-06-13 DIAGNOSIS — Z0000 Encounter for general adult medical examination without abnormal findings: Secondary | ICD-10-CM | POA: Diagnosis not present

## 2022-10-27 IMAGING — CR DG CHEST 2V
2 series · 2 of 2 positions shown · non-contrast
Comparison: None.

CLINICAL DATA: Cough, wheezing for 1 month

EXAM:
CHEST - 2 VIEW

[w chest pa]
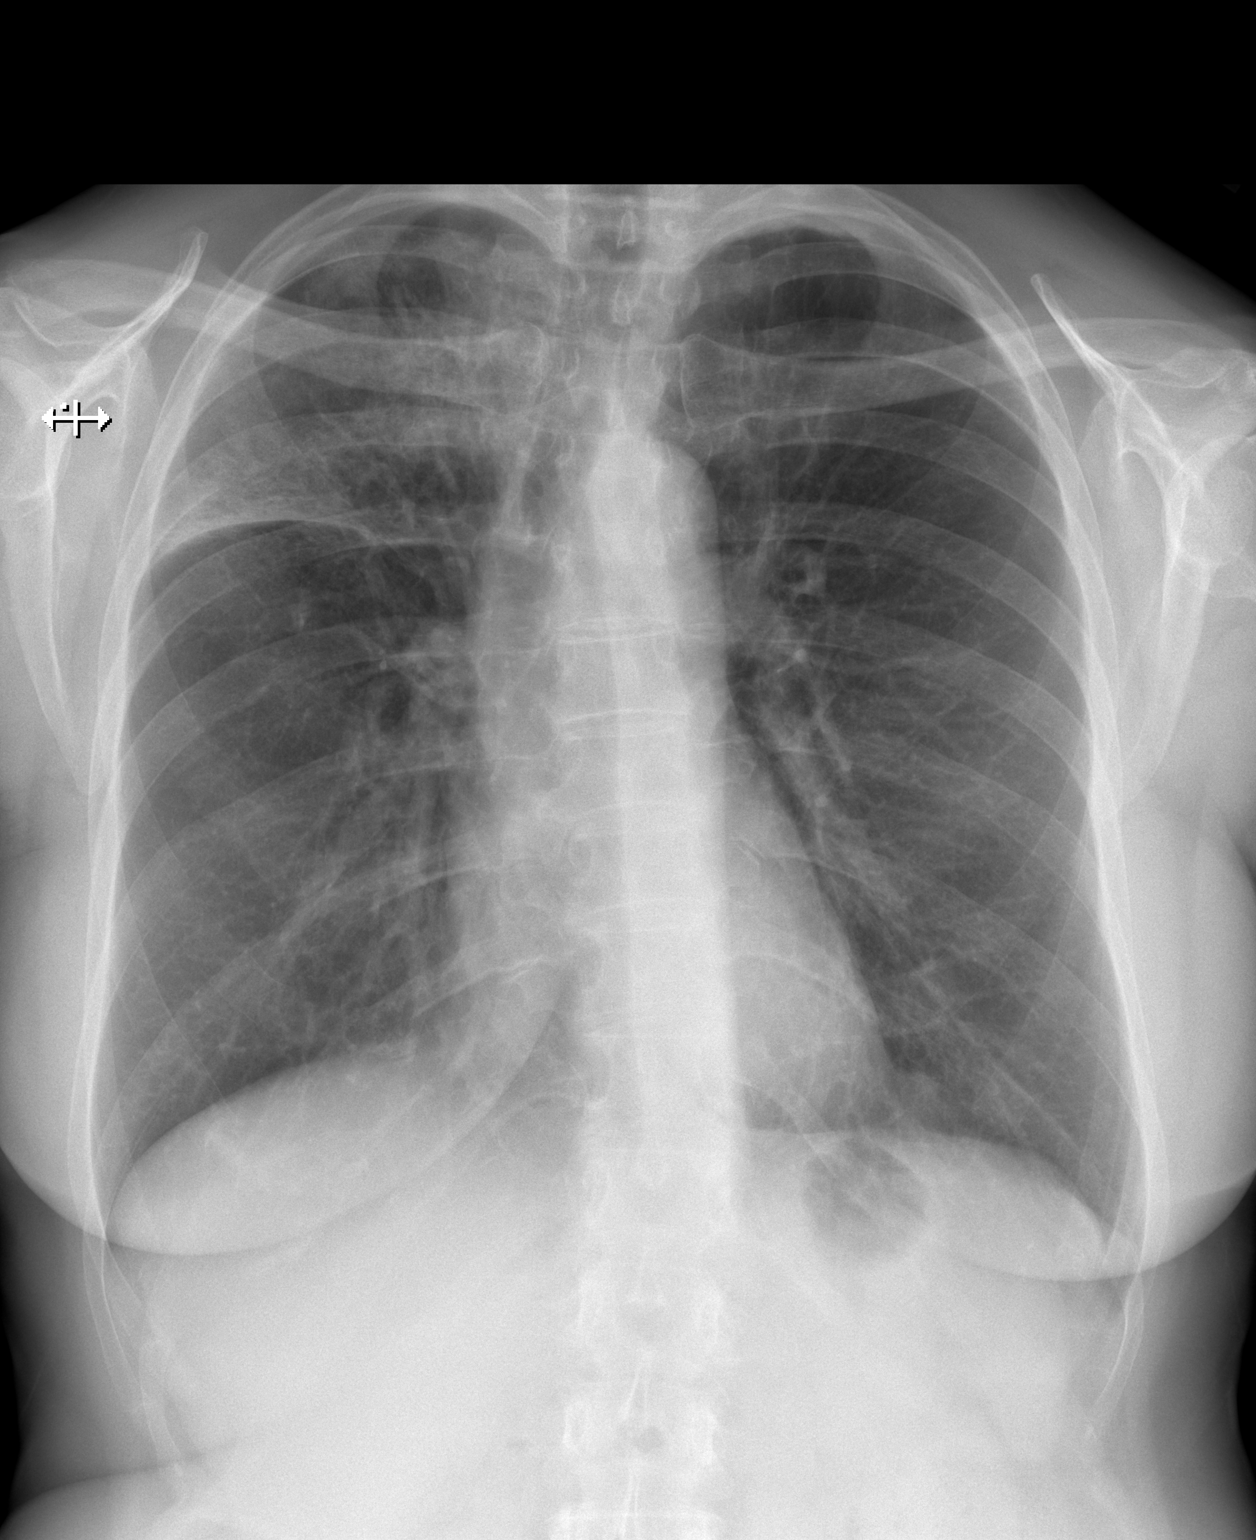

[w chest lat]
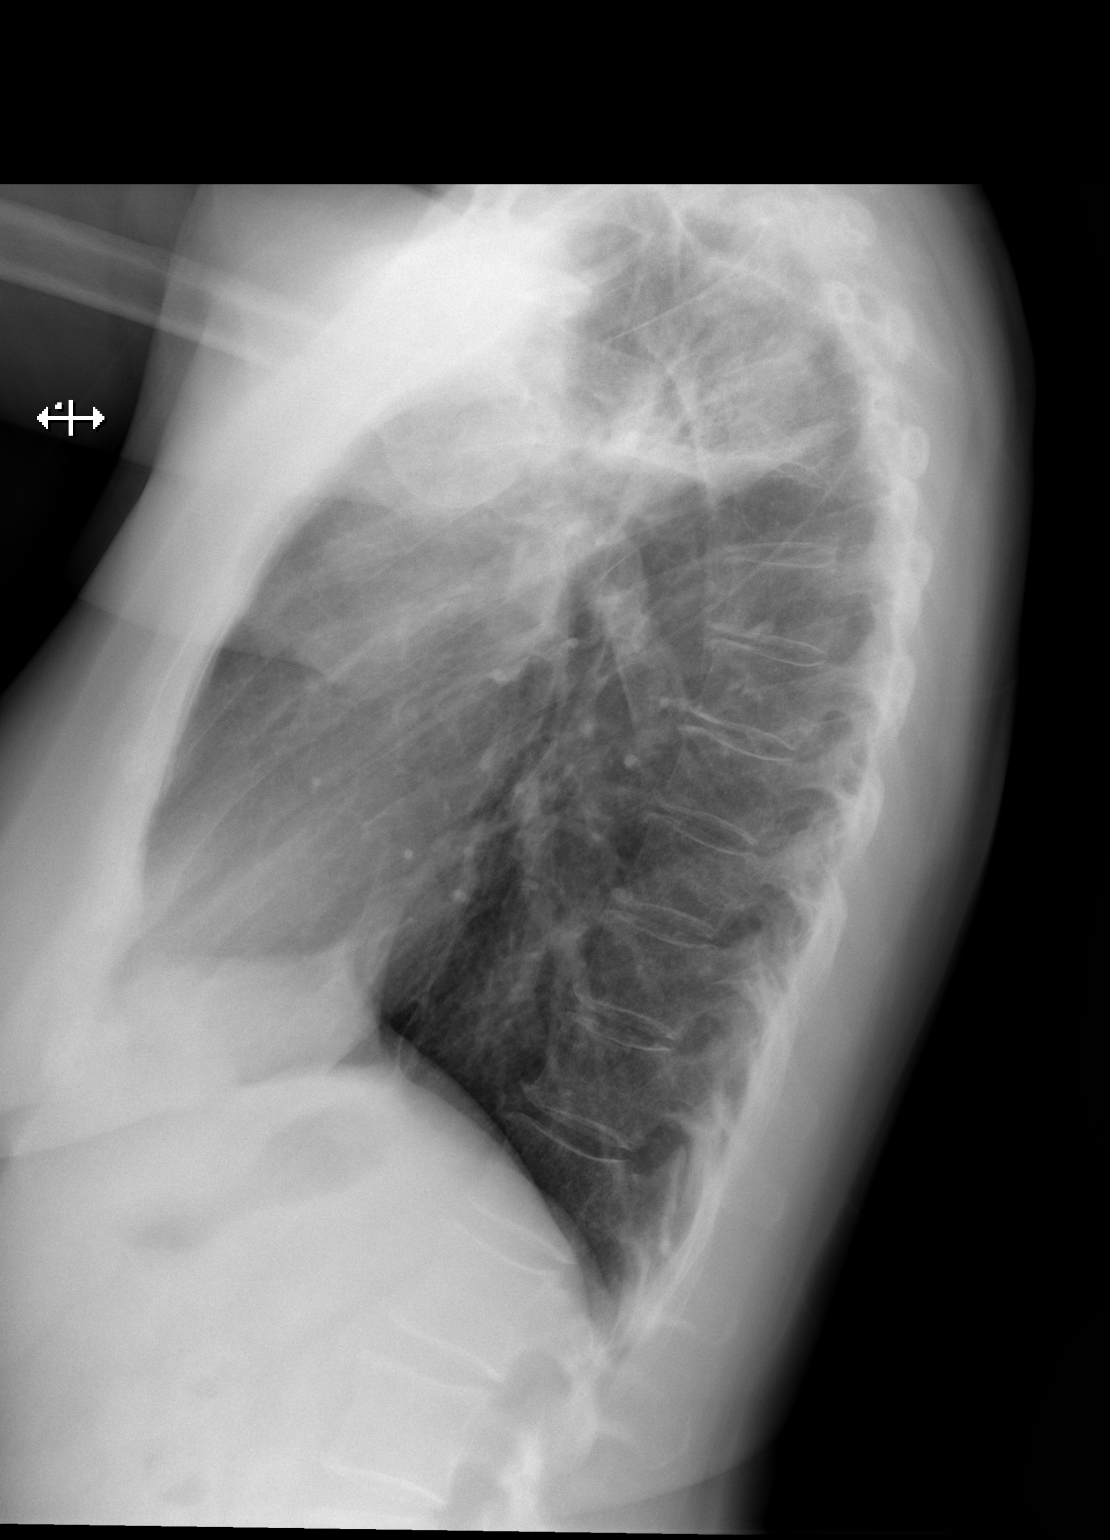

[2 of 2 positions shown; findings below may reference images not displayed]

FINDINGS: The cardiomediastinal silhouette is normal.

There is patchy opacity in the right upper lobe. The lungs are
otherwise clear. There is no pleural effusion. There is no
pneumothorax.

There is mild S shaped scoliosis of the thoracic spine. There is no
acute osseous abnormality.
IMPRESSION: Patchy opacity in the right upper lobe is concerning for pneumonia.
Recommend follow-up radiographs in 3-4 weeks to assess for
resolution.

## 2023-03-29 DIAGNOSIS — H524 Presbyopia: Secondary | ICD-10-CM | POA: Diagnosis not present

## 2023-03-29 DIAGNOSIS — D23122 Other benign neoplasm of skin of left lower eyelid, including canthus: Secondary | ICD-10-CM | POA: Diagnosis not present

## 2023-03-29 DIAGNOSIS — H0100A Unspecified blepharitis right eye, upper and lower eyelids: Secondary | ICD-10-CM | POA: Diagnosis not present

## 2023-03-29 DIAGNOSIS — H2513 Age-related nuclear cataract, bilateral: Secondary | ICD-10-CM | POA: Diagnosis not present

## 2023-03-29 DIAGNOSIS — H40013 Open angle with borderline findings, low risk, bilateral: Secondary | ICD-10-CM | POA: Diagnosis not present

## 2023-03-29 DIAGNOSIS — H5203 Hypermetropia, bilateral: Secondary | ICD-10-CM | POA: Diagnosis not present

## 2023-03-29 DIAGNOSIS — H0100B Unspecified blepharitis left eye, upper and lower eyelids: Secondary | ICD-10-CM | POA: Diagnosis not present

## 2023-04-20 DIAGNOSIS — Z1231 Encounter for screening mammogram for malignant neoplasm of breast: Secondary | ICD-10-CM | POA: Diagnosis not present

## 2023-05-02 DIAGNOSIS — N6311 Unspecified lump in the right breast, upper outer quadrant: Secondary | ICD-10-CM | POA: Diagnosis not present

## 2023-05-02 DIAGNOSIS — N6001 Solitary cyst of right breast: Secondary | ICD-10-CM | POA: Diagnosis not present

## 2023-05-11 ENCOUNTER — Other Ambulatory Visit: Payer: Self-pay

## 2023-05-11 DIAGNOSIS — N6311 Unspecified lump in the right breast, upper outer quadrant: Secondary | ICD-10-CM | POA: Diagnosis not present

## 2023-05-11 DIAGNOSIS — N6021 Fibroadenosis of right breast: Secondary | ICD-10-CM | POA: Diagnosis not present

## 2023-05-12 LAB — SURGICAL PATHOLOGY

## 2023-06-14 DIAGNOSIS — N644 Mastodynia: Secondary | ICD-10-CM | POA: Diagnosis not present

## 2023-06-21 DIAGNOSIS — Z0000 Encounter for general adult medical examination without abnormal findings: Secondary | ICD-10-CM | POA: Diagnosis not present

## 2023-06-21 DIAGNOSIS — Z87891 Personal history of nicotine dependence: Secondary | ICD-10-CM | POA: Diagnosis not present

## 2023-06-21 DIAGNOSIS — E7800 Pure hypercholesterolemia, unspecified: Secondary | ICD-10-CM | POA: Diagnosis not present

## 2023-06-21 DIAGNOSIS — Z6826 Body mass index (BMI) 26.0-26.9, adult: Secondary | ICD-10-CM | POA: Diagnosis not present

## 2023-06-21 DIAGNOSIS — M8580 Other specified disorders of bone density and structure, unspecified site: Secondary | ICD-10-CM | POA: Diagnosis not present

## 2023-06-21 DIAGNOSIS — Z860101 Personal history of adenomatous and serrated colon polyps: Secondary | ICD-10-CM | POA: Diagnosis not present

## 2023-06-21 DIAGNOSIS — E039 Hypothyroidism, unspecified: Secondary | ICD-10-CM | POA: Diagnosis not present

## 2023-06-21 DIAGNOSIS — Z131 Encounter for screening for diabetes mellitus: Secondary | ICD-10-CM | POA: Diagnosis not present

## 2023-10-12 DIAGNOSIS — N644 Mastodynia: Secondary | ICD-10-CM | POA: Diagnosis not present

## 2023-10-12 DIAGNOSIS — N6081 Other benign mammary dysplasias of right breast: Secondary | ICD-10-CM | POA: Diagnosis not present

## 2024-01-19 DIAGNOSIS — R928 Other abnormal and inconclusive findings on diagnostic imaging of breast: Secondary | ICD-10-CM | POA: Diagnosis not present
